# Patient Record
Sex: Male | Born: 1940
Health system: Southern US, Community
[De-identification: ages and names within clinical notes are randomized; demographics above are authoritative.]

## PROBLEM LIST (undated history)

## (undated) DIAGNOSIS — C61 Malignant neoplasm of prostate: Secondary | ICD-10-CM

## (undated) DIAGNOSIS — I10 Essential (primary) hypertension: Secondary | ICD-10-CM

## (undated) DIAGNOSIS — M722 Plantar fascial fibromatosis: Secondary | ICD-10-CM

## (undated) DIAGNOSIS — E785 Hyperlipidemia, unspecified: Secondary | ICD-10-CM

## (undated) DIAGNOSIS — I2510 Atherosclerotic heart disease of native coronary artery without angina pectoris: Secondary | ICD-10-CM

## (undated) DIAGNOSIS — N189 Chronic kidney disease, unspecified: Secondary | ICD-10-CM

## (undated) HISTORY — DX: Hyperlipidemia, unspecified: E78.5

## (undated) HISTORY — DX: Plantar fascial fibromatosis: M72.2

## (undated) HISTORY — DX: Atherosclerotic heart disease of native coronary artery without angina pectoris: I25.10

## (undated) HISTORY — DX: Malignant neoplasm of prostate: C61

## (undated) HISTORY — DX: Essential (primary) hypertension: I10

---

## 2002-08-12 ENCOUNTER — Emergency Department (HOSPITAL_COMMUNITY): Admission: EM | Admit: 2002-08-12 | Discharge: 2002-08-12 | Payer: Self-pay | Admitting: Emergency Medicine

## 2002-08-12 ENCOUNTER — Encounter: Payer: Self-pay | Admitting: Emergency Medicine

## 2003-08-15 ENCOUNTER — Inpatient Hospital Stay (HOSPITAL_COMMUNITY): Admission: EM | Admit: 2003-08-15 | Discharge: 2003-08-17 | Payer: Self-pay | Admitting: Emergency Medicine

## 2003-08-20 ENCOUNTER — Encounter: Admission: RE | Admit: 2003-08-20 | Discharge: 2003-08-20 | Payer: Self-pay | Admitting: Family Medicine

## 2003-09-20 ENCOUNTER — Inpatient Hospital Stay (HOSPITAL_COMMUNITY): Admission: RE | Admit: 2003-09-20 | Discharge: 2003-09-22 | Payer: Self-pay | Admitting: Urology

## 2003-09-20 ENCOUNTER — Encounter (INDEPENDENT_AMBULATORY_CARE_PROVIDER_SITE_OTHER): Payer: Self-pay | Admitting: Specialist

## 2004-09-19 ENCOUNTER — Ambulatory Visit: Admission: RE | Admit: 2004-09-19 | Discharge: 2004-09-19 | Payer: Self-pay | Admitting: Cardiology

## 2004-09-19 ENCOUNTER — Ambulatory Visit: Payer: Self-pay | Admitting: Cardiology

## 2004-09-19 ENCOUNTER — Inpatient Hospital Stay (HOSPITAL_COMMUNITY): Admission: EM | Admit: 2004-09-19 | Discharge: 2004-09-23 | Payer: Self-pay | Admitting: Emergency Medicine

## 2004-09-23 IMAGING — CR DG CHEST 2V
2 series · 2 of 2 positions shown · non-contrast
Comparison: none

CLINICAL DATA: Chest pain.
 CHEST X-RAY:
 Two views of the chest are compared to a chest x-ray [DATE].  The lungs are clear.  The heart is within normal limits in size.  No bony abnormality is seen.

[w chest pa]
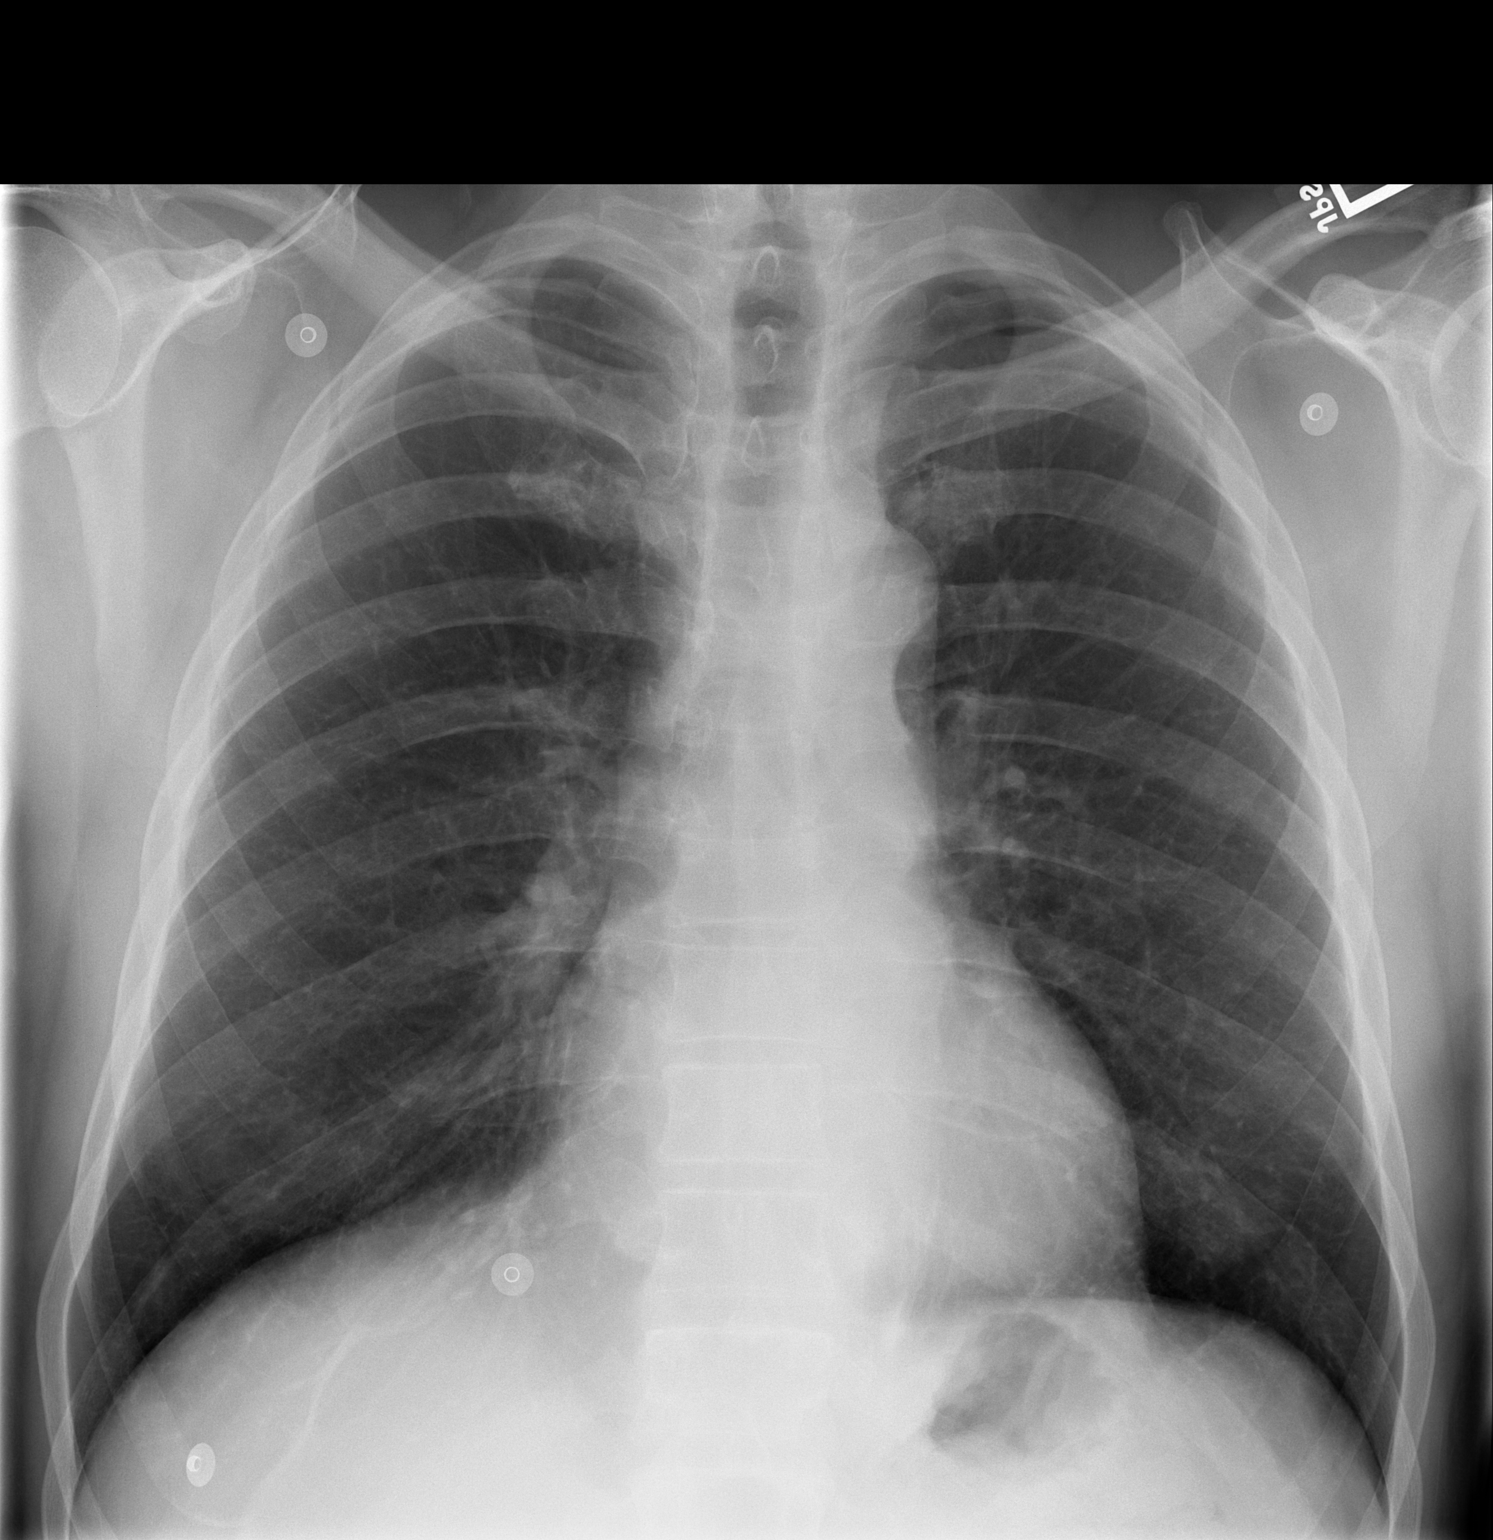

[w chest lat]
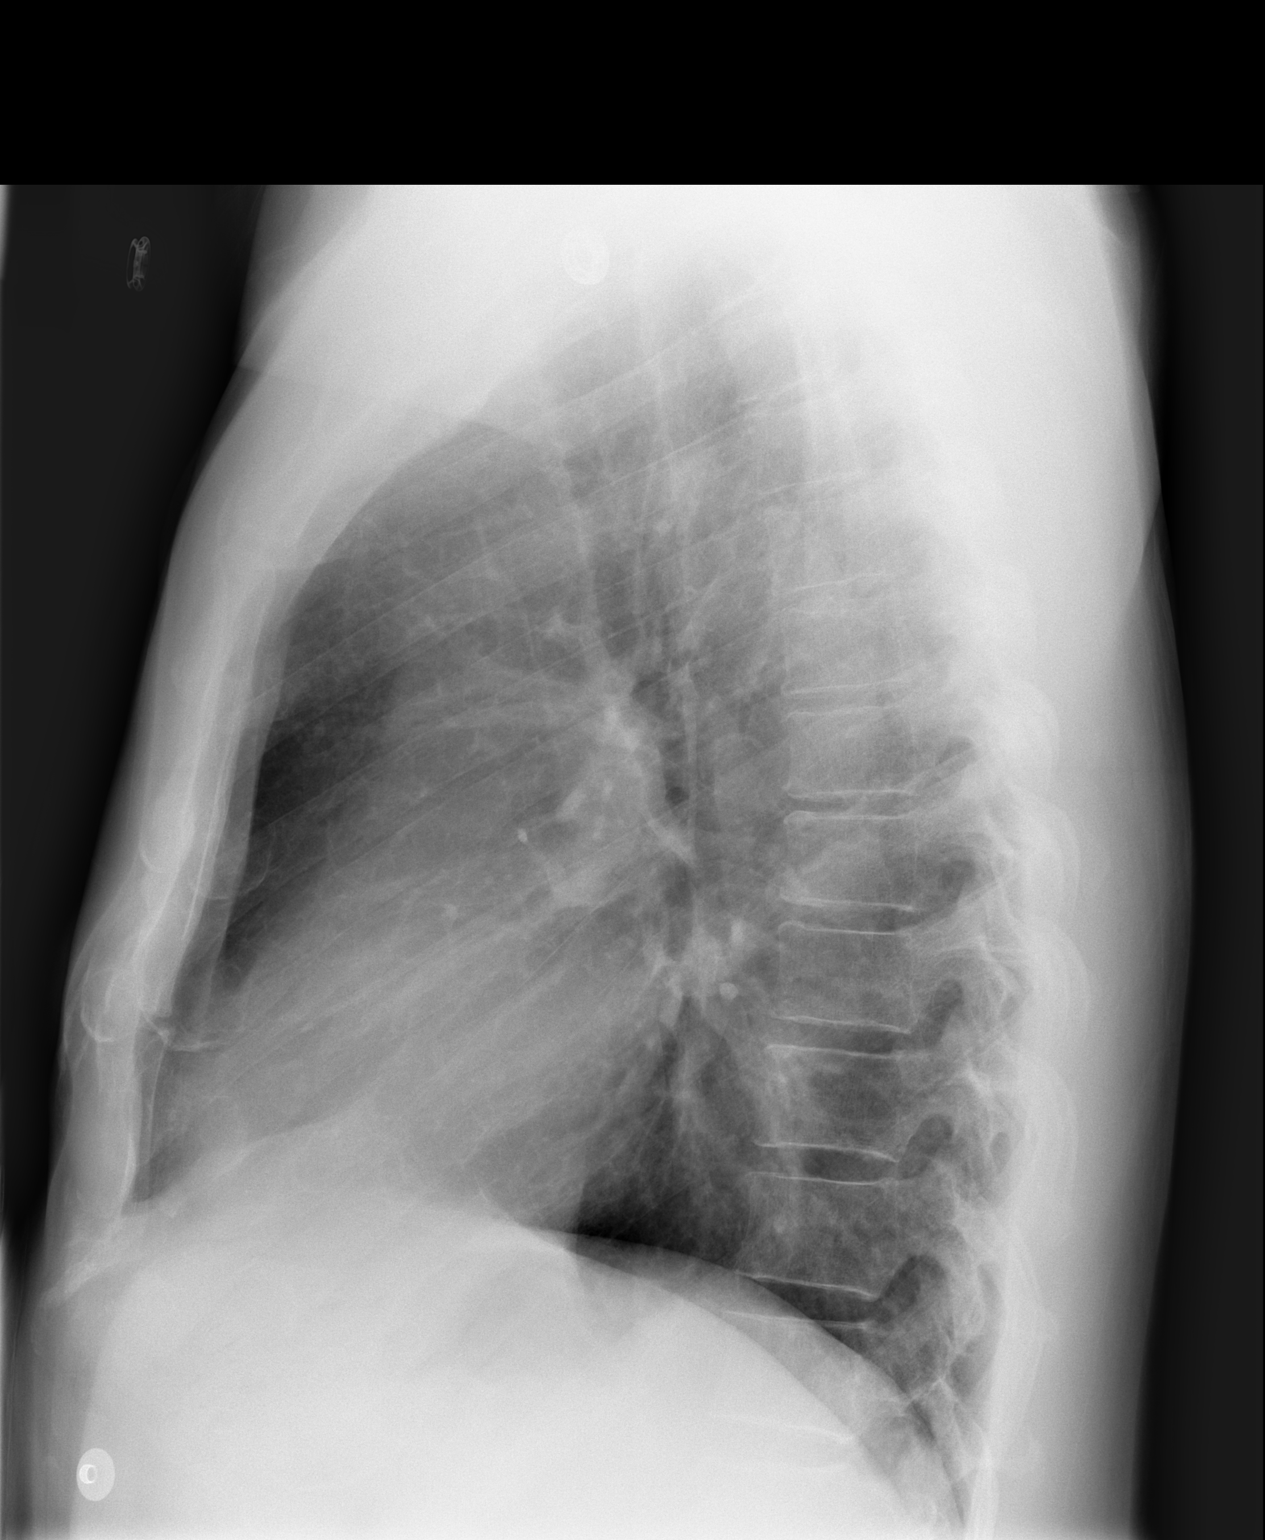

[2 of 2 positions shown; findings below may reference images not displayed]

IMPRESSION: No active lung disease.

## 2004-10-02 ENCOUNTER — Encounter (HOSPITAL_COMMUNITY): Admission: RE | Admit: 2004-10-02 | Discharge: 2004-11-01 | Payer: Self-pay | Admitting: Cardiology

## 2004-10-09 ENCOUNTER — Ambulatory Visit: Payer: Self-pay | Admitting: Cardiology

## 2004-10-17 ENCOUNTER — Ambulatory Visit: Payer: Self-pay | Admitting: Cardiology

## 2004-10-29 ENCOUNTER — Ambulatory Visit: Payer: Self-pay | Admitting: *Deleted

## 2004-10-29 ENCOUNTER — Ambulatory Visit: Payer: Self-pay

## 2004-11-03 ENCOUNTER — Encounter (HOSPITAL_COMMUNITY): Admission: RE | Admit: 2004-11-03 | Discharge: 2004-12-03 | Payer: Self-pay | Admitting: Cardiology

## 2004-12-05 ENCOUNTER — Encounter (HOSPITAL_COMMUNITY): Admission: RE | Admit: 2004-12-05 | Discharge: 2005-01-04 | Payer: Self-pay | Admitting: Cardiology

## 2004-12-18 ENCOUNTER — Ambulatory Visit: Payer: Self-pay | Admitting: Cardiology

## 2005-01-05 ENCOUNTER — Encounter (HOSPITAL_COMMUNITY): Admission: RE | Admit: 2005-01-05 | Discharge: 2005-02-04 | Payer: Self-pay | Admitting: Cardiology

## 2005-06-08 HISTORY — PX: PROSTATECTOMY: SHX69

## 2005-10-02 ENCOUNTER — Ambulatory Visit: Payer: Self-pay | Admitting: Cardiology

## 2005-10-06 ENCOUNTER — Ambulatory Visit (HOSPITAL_BASED_OUTPATIENT_CLINIC_OR_DEPARTMENT_OTHER): Admission: RE | Admit: 2005-10-06 | Discharge: 2005-10-06 | Payer: Self-pay | Admitting: Urology

## 2005-10-31 ENCOUNTER — Inpatient Hospital Stay (HOSPITAL_COMMUNITY): Admission: EM | Admit: 2005-10-31 | Discharge: 2005-11-02 | Payer: Self-pay | Admitting: Emergency Medicine

## 2005-10-31 ENCOUNTER — Ambulatory Visit: Payer: Self-pay | Admitting: Internal Medicine

## 2005-11-23 ENCOUNTER — Ambulatory Visit: Payer: Self-pay | Admitting: Cardiology

## 2006-01-27 ENCOUNTER — Ambulatory Visit: Payer: Self-pay | Admitting: Cardiology

## 2007-02-23 ENCOUNTER — Ambulatory Visit: Payer: Self-pay | Admitting: Cardiology

## 2007-09-27 ENCOUNTER — Ambulatory Visit: Payer: Self-pay | Admitting: Cardiovascular Disease

## 2007-09-27 ENCOUNTER — Inpatient Hospital Stay (HOSPITAL_COMMUNITY): Admission: EM | Admit: 2007-09-27 | Discharge: 2007-10-04 | Payer: Self-pay | Admitting: Internal Medicine

## 2007-09-28 ENCOUNTER — Encounter: Payer: Self-pay | Admitting: Cardiothoracic Surgery

## 2007-09-29 ENCOUNTER — Encounter: Payer: Self-pay | Admitting: Internal Medicine

## 2007-09-29 ENCOUNTER — Ambulatory Visit: Payer: Self-pay | Admitting: Cardiothoracic Surgery

## 2007-10-02 ENCOUNTER — Encounter: Payer: Self-pay | Admitting: Cardiology

## 2007-10-21 ENCOUNTER — Ambulatory Visit: Payer: Self-pay | Admitting: Cardiology

## 2008-03-08 ENCOUNTER — Ambulatory Visit: Payer: Self-pay | Admitting: Cardiology

## 2009-01-23 ENCOUNTER — Encounter (INDEPENDENT_AMBULATORY_CARE_PROVIDER_SITE_OTHER): Payer: Self-pay | Admitting: *Deleted

## 2009-03-15 DIAGNOSIS — I2510 Atherosclerotic heart disease of native coronary artery without angina pectoris: Secondary | ICD-10-CM | POA: Insufficient documentation

## 2009-03-15 DIAGNOSIS — E785 Hyperlipidemia, unspecified: Secondary | ICD-10-CM

## 2009-03-15 DIAGNOSIS — I10 Essential (primary) hypertension: Secondary | ICD-10-CM | POA: Insufficient documentation

## 2009-03-20 ENCOUNTER — Ambulatory Visit: Payer: Self-pay | Admitting: Cardiology

## 2009-04-01 ENCOUNTER — Encounter: Payer: Self-pay | Admitting: Cardiology

## 2009-04-02 ENCOUNTER — Telehealth: Payer: Self-pay | Admitting: Cardiology

## 2009-06-18 ENCOUNTER — Encounter (INDEPENDENT_AMBULATORY_CARE_PROVIDER_SITE_OTHER): Payer: Self-pay | Admitting: *Deleted

## 2009-10-31 ENCOUNTER — Emergency Department (HOSPITAL_COMMUNITY): Admission: EM | Admit: 2009-10-31 | Discharge: 2009-10-31 | Payer: Self-pay | Admitting: Emergency Medicine

## 2010-03-26 ENCOUNTER — Ambulatory Visit: Payer: Self-pay | Admitting: Cardiology

## 2010-03-28 ENCOUNTER — Encounter: Payer: Self-pay | Admitting: Cardiology

## 2010-07-08 NOTE — Miscellaneous (Signed)
Summary: update med  Clinical Lists Changes  Medications: Added new medication of PLAVIX 75 MG TABS (CLOPIDOGREL BISULFATE) Take one tablet by mouth daily 

## 2010-07-08 NOTE — Assessment & Plan Note (Signed)
Summary: [facility]  Medications Added ASPIRIN 81 MG  TABS (ASPIRIN) 1 by mouth daily FISH OIL   OIL (FISH OIL) 1 by mouth daily VITAMIN D3 1000 UNIT CAPS (CHOLECALCIFEROL) 1 by mouth daily      Allergies Added: NKDA  Visit Type:  Follow-up Primary Provider:  [facility]  CC:  CAD.  History of Present Illness: The patient presents for one year followup. Since I last saw him he has done very though retired he is very active. This he denies any chest discomfort, neck or arm discomfort. He never had palpitations, presyncope or syncope. He has no PND or orthopnea. He has had no weight gain or edema.  Current Medications (verified): 1)  Metoprolol Tartrate 50 Mg Tabs (Metoprolol Tartrate) .... 1/2 Two Times A Day 2)  Crestor 20 Mg Tabs (Rosuvastatin Calcium) .Marland Kitchen.. 1 By Mouth Daily 3)  Plavix 75 Mg Tabs (Clopidogrel Bisulfate) .... Take One Tablet By Mouth Daily 4)  Aspirin 81 Mg  Tabs (Aspirin) .Marland Kitchen.. 1 By Mouth Daily 5)  Fish Oil   Oil (Fish Oil) .Marland Kitchen.. 1 By Mouth Daily 6)  Vitamin D3 1000 Unit Caps (Cholecalciferol) .Marland Kitchen.. 1 By Mouth Daily  Allergies (verified): No Known Drug Allergies  Past History:  Past Medical History: Last updated: 03/15/2009   Coronary artery disease (recent inferior   myocardial infarction).  The patient had previous stenting of his right   coronary artery.  Subsequently, he had stent thrombosis in the right   coronary stent and failed to keep that vessel loop even after   restenting.  He also had LAD 50-60% stenosis, mid to proximal, and OM1   40% stenosis), well-preserved ejection fraction, hypertension,   hyperlipidemia, prostate cancer status post resection in April 2005,   constipation, hemorrhoids.   Past Surgical History: Prostatectomy in 2007   Review of Systems       As stated in the HPI and negative for all other systems.   Vital Signs:  Patient profile:   69 year old male Height:      192 inches Weight:      69 pounds BMI:      1.32 Pulse rate:   57 / minute Resp:     18 per minute BP sitting:   132 / 82  (right arm)  Vitals Entered By: Marrion Coy, CNA (March 26, 2010 1:18 PM)  Physical Exam  General:  Well developed, well nourished, in no acute distress. Head:  normocephalic and atraumatic Eyes:  PERRLA/EOM intact; conjunctiva and lids normal. Mouth:  Teeth, gums and palate normal. Oral mucosa normal. Neck:  Neck supple, no JVD. No masses, thyromegaly or abnormal cervical nodes. Chest Wall:  no deformities or breast masses noted Lungs:  Clear bilaterally to auscultation and percussion. Heart:  Non-displaced PMI, chest non-tender; regular rate and rhythm, S1, S2 without murmurs, rubs or gallops. Carotid upstroke normal, no bruit. Normal abdominal aortic size, no bruits. Femorals normal pulses, no bruits. Pedals normal pulses. No edema, no varicosities. Abdomen:  Bowel sounds positive; abdomen soft and non-tender without masses, organomegaly, or hernias noted. No hepatosplenomegaly. Msk:  Back normal, normal gait. Muscle strength and tone normal. Extremities:  No clubbing or cyanosis. Neurologic:  Alert and oriented x 3. Skin:  Intact without lesions or rashes. Cervical Nodes:  no significant adenopathy Inguinal Nodes:  no significant adenopathy Psych:  Normal affect.   EKG  Procedure date:  03/26/2010  Findings:      Sinus rhythm, rate 56, axis  within normal limits, old inferior infarct with chronic inferior T-wave inversions, early transition lead 2, no acute ST-T wave changes  Impression & Recommendations:  Problem # 1:  CAD (ICD-414.00) He has no new symptoms. He will continue with risk reduction in meds as listed.  Problem # 2:  HYPERTENSION (ICD-401.9) His blood pressure is well controlled. We will continue meds as listed.  Problem # 3:  HYPERLIPIDEMIA (ICD-272.4) I will repeat a lipid profile. His ratio in October of last year was excellent. He will have liver enzymes as well.

## 2010-10-21 NOTE — Assessment & Plan Note (Signed)
[facility]                            CARDIOLOGY OFFICE NOTE   NAME:Nicholas [name], Nicholas [name]                      MRN:          [id number]  DATE:03/20/2009                            DOB:          07/05/1940    PRIMARY CARE PHYSICIAN:  [name], MD   REASON FOR PRESENTATION:  Coronary artery disease.   HISTORY OF PRESENT ILLNESS:  The patient presents for followup of the  above.  Since I last saw him, he has continued to do quite well.  He is  active.  He works vigorous job.  With this, he denies any chest  discomfort, neck or arm discomfort.  He has no palpitations, presyncope,  or syncope.  He has had no PND or orthopnea.  He takes medications  routinely.  He has not had his lipids checked in quite a while.   PAST MEDICAL HISTORY:  Coronary artery disease (recent inferior  myocardial infarction status post stenting of the right coronary artery.  He subsequently had stent thrombosis in the right stent and failed to  keep that vessel open even after restenting.  He has LAD at 50-60%  stenosis mid to proximal, and OM1 had 40% stenosis), well-preserved  ejection fraction, hypertension, hyperlipidemia, prostate cancer status  post resection in 2005, constipation, hemorrhoids.   ALLERGIES:  None.   MEDICATIONS:  1. Plavix 75 mg daily.  2. Fish oil.  3. Crestor 20 mg daily.  4. Toprol 25 mg b.i.d.  5. Aspirin 81 mg daily.  6. Multivitamin.   REVIEW OF SYSTEMS:  As stated in the HPI, and otherwise negative for all  other systems.   PHYSICAL EXAMINATION:  GENERAL:  The patient is in no distress.  VITAL SIGNS:  Blood pressure 124/84, heart rate 65 and regular, weight  192 pounds, body mass index 29.  HEENT:  Eyelids are unremarkable.  Pupils equal, round, and reactive to  light.  Fundi not visualized.  Oral mucosa normal.  NECK:  No jugular venous distention at 45 degrees.  Carotid upstroke  brisk and symmetric.  No bruits.  No thyromegaly.  LYMPHATICS:  No cervical, axillary, or inguinal adenopathy.  LUNGS:  Clear to auscultation bilaterally.  BACK:  No costovertebral angle tenderness.  CHEST:  Unremarkable.  HEART:  PMI not displaced or sustained, S1 and S2 within normal.  No S3.  No S4.  No clicks.  No rubs.  No murmurs.  ABDOMEN:  Flat, positive bowel sounds, normal in frequency and pitch, no  bruits, rebound, guarding, or midline pulsatile mass.  No hepatomegaly.  No splenomegaly.  SKIN:  No rashes.  No nodules.  EXTREMITIES:  Pulses 2+ throughout.  No edema, cyanosis, or clubbing.  NEURO:  Oriented to person, place, and time.  Cranial nerves II through  XII grossly intact.  Motor grossly intact.   EKG; sinus rhythm, rate 65, axis within normal limits, intervals within  normal limits, old inferior infarct, no acute ST-T wave change.   ASSESSMENT AND PLAN:  1. Coronary artery disease.  The patient is having no new symptoms.      No  further cardiovascular testing is suggested.  He will continue      with risk reduction.  2. Dyslipidemia.  I would check a lipid profile and liver enzymes when      he comes back and he has been given instructions to come back      fasting.  3. Hypertension.  Blood pressure is controlled and he will continue      the meds as listed.  4. Followup.  We will see him back in 1 year or sooner if needed.     Rollene Rotunda, MD, [facility]  Electronically Signed    JH/MedQ  DD: 03/20/2009  DT: 03/21/2009  Job #: [id number]   cc:   [name], M.D.

## 2010-10-21 NOTE — Cardiovascular Report (Signed)
NAME:  Nicholas [name], Nicholas [name] NO.:  [id number]   MEDICAL RECORD NO.:  [id number]          PATIENT TYPE:  OIB   LOCATION:  2903                         FACILITY:  [facility]   PHYSICIAN:  Veverly Fells. Excell Seltzer, MD  DATE OF BIRTH:  March 27, 1941   DATE OF PROCEDURE:  09/28/2007  DATE OF DISCHARGE:                            CARDIAC CATHETERIZATION   PROCEDURES:  1. Selective coronary angiography.  2. Aspiration thrombectomy.  3. Percutaneous transluminal coronary angioplasty and stenting of the      right coronary artery.   INDICATIONS:  Nicholas [name] is a very nice 69 year old gentleman who  presented yesterday with an acute myocardial infarction.  This was  secondary to stent thrombosis in the distal right coronary artery.  He  has a Horizon stent that was originally placed.  He did well with his  intervention.  A 32-mm Liberte bare metal stent was placed in the vessel  inside of the previous stent yesterday.  He had been continued on  Integrilin until this morning, which took him out to 18 hours.  He was  loaded with 600 mg of Plavix during the procedure.  Earlier this morning  after his Integrilin had been discontinued, he developed recurrent  severe substernal chest pain.  His EKG was essentially unchanged but his  symptoms were identical to yesterday.  He was brought back emergently  for re-look catheterization with a high index of suspicion of  reocclusion.   Risks and indications of the procedure were reviewed with the patient.  Emergency consent was obtained.  The left groin was prepped, draped,  anesthetized with 1% lidocaine.  Using modified Seldinger technique, a 6-  French sheath was placed in the left femoral artery.  A 6-French JL-4  catheter was inserted and right coronary angiography was performed.  This demonstrated a totally-occluded vessel proximal to the stent.  Fifth units per kg of heparin was given, Integrilin double bolus and  drip was given.  A 6-French  guide catheter was inserted and once a  therapeutic ACT was achieved, a Cougar guidewire was passed without  difficulty beyond the occlusion and into the stented segment within the  distal vessel.  There was a moderate of difficulty getting through the  stent.  I passed an aspiration catheter down and was able to aspirate  through the proximal and midportion of the vessel but was not able to  get into the stent itself.  Following aspiration, an angiography  demonstrated the appearance of spiral dissection in the midportion of  the vessel.  A 2.0 x 20-mm balloon was taken down into the stent and  multiple inflations were done at up to 14 atmospheres.  Balloon  inflations were performed all the way through the stented segment and  even proximal to the stent.  The balloon was then passed distally and  was inflated into the posterior AV segment since there was no flow into  that vessel.  The wire was then redirected across an occluded PDA branch  and the balloon was inflated into the PDA as well.  At that point I  upsized the  balloon to a 3.0 x 20 and did multiple inflations in the  stent.  Following that there was flow restored into the posterior AV  segment and posterolateral branches.  There was significant residual  thrombus in the distal stent, and the PDA remained occluded.  There was  a large area of thrombus and probable dissection throughout the  midportion of the right coronary artery as well.  The same 2.0 x 20-mm  balloon was once again advanced down into the posterolateral branch and  repeat balloon inflations were done up to 10 atmospheres to try to  improve flow and residual thrombus in that area.  At that point I  elected to treat the midportion of the right coronary artery, which  appeared to have a large spiral dissection.  In an overlapping fashion  from the previously-placed distal stent, a 3.5 x 32-mm Liberte stent was  placed and deployed at 16 atmospheres.  A second  stent was necessary to  cover the proximal dissection and a 4.0 x 32-mm Liberte was chosen.  This was deployed at 16 atmospheres as well in overlapping fashion with  the mid stent.  Angiography following stenting showed improvement in the  area of dissection, but there was sluggish flow in the entire vessel  with TIMI-2 flow.  There was residual thrombus just at the bifurcation  of the distal right coronary artery into the PDA and posterolateral  branch.  A final 2.0 x 15-mm balloon was passed down into the  posterolateral branch and prolonged inflations of 10 atmospheres were  done.  The long stented segment was postdilated with a 4.0 x 20-mm  Quantum Maverick, which was taken to 18 atmospheres on multiple  inflations.  At the completion of the procedure the patient was chest  pain-free.  There was residual laminated thrombus at the bifurcation of  the distal right coronary artery into the posterolateral branch.  The  PDA remained occluded.  The proximal vessel and entire stented segment  appeared well intact.  The distal stent that was placed yesterday had  laminated thrombus as well.  The flow was TIMI-2.   The guide catheter and wire were removed and an Angio-Seal device was  used to close the femoral arteriotomy.   FINDINGS:  The native right coronary artery is occluded in the mid to  distal portion with heavy thrombus and possible dissection in the  midportion of the vessel.  The occlusion is proximal to the stent in the  distal RCA that was placed yesterday.  Initial flow was TIMI-0.  Flow at  the completion of the procedure was TIMI-2.   ASSESSMENT:  Acute reocclusion of the right coronary artery 24 hours  post percutaneous coronary intervention for an acute myocardial  infarction.  The event yesterday was secondary to stent thrombosis, and  today's event was either secondary to recurrent stent thrombosis versus  coronary dissection.   PLAN:  I am going to continue Nicholas [name]  on Integrilin.  His distal  vessel is relatively small and may not be suitable for bypass, but I am  going to consult CVTS for consideration of coronary bypass.  He has  residual moderate stenosis in the LAD.  While the inferior wall  territory does not appear large at this point, he becomes highly  symptomatic with reocclusion.  I will hold his Plavix for the time being  and continue him on Integrilin.      Veverly Fells. Excell Seltzer, MD  Electronically Signed  MDC/MEDQ  D:  09/28/2007  T:  09/28/2007  Job:  [id number]

## 2010-10-21 NOTE — H&P (Signed)
NAME:  Nicholas [name], Nicholas [name] NO.:  [id number]   MEDICAL RECORD NO.:  [id number]          PATIENT TYPE:  OIB   LOCATION:  2903                         FACILITY:  [facility]   PHYSICIAN:  Nicholas Fells. Excell Seltzer, MD  DATE OF BIRTH:  08-06-1940   DATE OF ADMISSION:  09/27/2007  DATE OF DISCHARGE:                              HISTORY & PHYSICAL   PRIMARY CARDIOLOGIST:  Nicholas Rotunda, MD, [facility].   PRIMARY CARE Nicholas [name]:  Nicholas [name], M.D.   PATIENT PROFILE:  The patient is a 70 year old married Caucasian male  with a prior history of inferior ST elevation MI treated with Horizon  study stent with recurrent MI secondary to stent thrombosis who presents  with recurrent inferior ST segment elevation MI.   PRIOR PROBLEMS:  1. Inferior ST segment elevation myocardial infarction/CAD.      a.     Status post inferior STEMI in April 2006, with occluded RCA       and placement of 3.5 x 32-mm Horizon study stent.      b.     Inferior STEMI on May 2007, status post cutting balloon       angioplasty for stent thrombosis and RCA.  2. Hypertension.  3. Hyperlipidemia.  4. Remote tobacco abuse.  5. History of prostate carcinoma.   HISTORY OF PRESENT ILLNESS:  This is a 70 year old married Caucasian  male with a history of CAD, status post inferior ST segment elevation MI  in April 2006, which was successfully treated with Horizon study stent.  He came off Plavix 1 year following that procedure and approximately 1  month later in May 2007, he had recurrent chest pain and inferior ST  segment elevation MI secondary to stent thrombosis.  This was  successfully treated with cutting balloon angioplasty.  He was in his  usual state of health until approximately 8:30 a.m. when he was outside  working around the house, developed sudden onset of 10/10 substernal  chest pressure with shortness of breath and diaphoresis.  He called the  EMS, they arrived at approximately 8:42 a.m.  EMS provided him  with 4  baby aspirin, 2  nitroglycerin, and 2 mg of Morphine.  ECG showed  inferior ST segment elevation and code STEMI was activated.  The patient  was taken to the [facility] cath lab and then arrived here approximately  9:15 a.m.  He still complaints of 5/10 chest pain, awaiting emergent  catheterization.   ALLERGIES:  No known drug allergies.   HOME MEDICATIONS:  1. Toprol-XL 25 mg daily.  2. Plavix 75 mg daily.  3. Pravachol 40 mg nightly.  4. Aspirin 81 mg daily.  5. Multivitamin daily.  6. Omega-3 fatty acids daily.   FAMILY HISTORY:  The mother died of cancer at 56.  The father died of an  MI at 33.   SOCIAL HISTORY:  He lives in [place] with his wife.  He has a 40-pack-  year history of tobacco abuse, quitting approximately 15 years ago.  Denies any alcohol or drugs.  Routinely exercises, but he is active at  work.  REVIEW OF SYSTEMS:  Positive for chest pain, shortness of breath,  dyspnea on exertion, diaphoresis.  He was incontinent of stool and  transit.  Otherwise, all system review negative.   PHYSICAL EXAMINATION:  VITAL SIGNS:  Afebrile, heart rate 55,  respirations 20, blood pressure 129/75, and pulse ox saturation 98% on  nonrebreather.  GENERAL:  Pleasant white male complaining of chest discomfort.  HEENT:  Normal.  SKIN:  Warm and dry without lesions or masses.  NECK:  No bruit or JVD.  LUNGS:  Respirations are unlabored.  Clear to auscultation.  CARDIAC:  Regular S1 and S2.  Positive S4.  No murmurs.  ABDOMEN:  Round, soft, nontender, nondistended, and bowel sounds  present.  EXTREMITIES:  Lower extremities warm, dry, pink.  No clubbing, cyanosis,  or edema.  Dorsalis pedis, posterior tibial pulses 2+ and equal  bilaterally.  NEURO:  Grossly intact.  Nonfocal.   LABORATORY DATA:  Chest x-ray is pending.  EKG shows sinus rhythm with 2-  to 3-mm inferior ST segment elevation.  Lab work is pending.   ASSESSMENT AND PLAN:  1. Acute inferior ST  segment elevation MI, plan for emergent cath.      The patient has been reloaded with 300 mg oral Plavix.  We will      continue his aspirin but increase dose of 325 daily.  Continue      statin and beta-blocker (unless he develops bradycardia and      hypertension with reperfusion).  Consider ACE inhibitor with      history of wall motion abnormality.  2. Hypertension, stable.  3. Hyperlipidemia.  Check lipids and LFTs.  Continue statin.      Nicholas [name], ANP      Nicholas Fells. Excell Seltzer, MD  Electronically Signed    CB/MEDQ  D:  09/27/2007  T:  09/28/2007  Job:  [id number]

## 2010-10-21 NOTE — Cardiovascular Report (Signed)
NAME:  Nicholas [name], LANGHAM NO.:  [id number]   MEDICAL RECORD NO.:  [id number]          PATIENT TYPE:  INP   LOCATION:  2009                         FACILITY:  [facility]   PHYSICIAN:  Corky Crafts, MDDATE OF BIRTH:  05-26-1941   DATE OF PROCEDURE:  09/29/2007  DATE OF DISCHARGE:  10/04/2007                            CARDIAC CATHETERIZATION   PROCEDURES PERFORMED:  Coronary angiogram, PCI of the right coronary  artery, aspiration thrombectomy.   INDICATIONS:  Recurrent ST-elevation MI and inferior stent thrombosis.   PROCEDURE AND NARRATIVE:  The risks and benefits of cardiac cath were  briefly explained to the patient.  He had undergone 2 catheterizations  in the past couple of days because of stent thrombosis.  He had  recurrent pain.  His right groin was infiltrated with 1% lidocaine.  A 6-  [place] arterial sheath was placed into the right femoral artery using  modified Seldinger technique.  Left coronary artery angiography was  performed using a JL-4 pigtail catheter.  The catheter was advanced to  the ostium under fluoroscopic guidance.  Digital angiography was  performed in multiple projections using hand injection contrast.  Right  coronary artery angiography was performed using a JR-4 guiding catheter  and PCI was then performed.  The sheath was removed using manual  compression.  Heparin and Integrilin were used for anticoagulation.   FINDINGS:  Left main is widely patent.  The left circumflex was widely  patent with mild irregularities.  The OM1 was widely patent also with  mild irregularities.  The left anterior descending had moderate  atherosclerosis in the proximal to mid section with normal flow.  The  first, second, and third diagonals were all small and widely patent.  The right coronary artery was occluded in the midportion at the most  recent stent.  There was TIMI 0 flow.  A JR-4 guiding catheter was used.  Prowater wire would not cross the  occluded area.  A whisper wire was  then used.  This did successfully crossed.  The [place] catheter was  advanced to the lesion, but it did not get all the way there.  Thrombectomy was performed without any successful aspiration of clot.  The guide was changed out to an AR-2.  The whisper wire crossed.  A 3.0  x 15 and 3.0 x 8 Voyager were advanced to the lesion without any  success.  The 3.0 x 8 was inflated 2 atmospheres for 20 seconds just to  try and change the geometry of the vessel to help advance the balloon,  but this still did not help to advance.  Hockey-stick guide was then  used and we could not get a Luge wire to cross.  The procedure was  aborted at that point.   IMPRESSION:  Recurrent inferior ST-elevation myocardial infarction,  unable to revascularize the right coronary artery.  There was  significant distal vessel disease with poor runoff based on the prior  cath and a very long stented area which likely contributed to the stent  thrombosis.   RECOMMENDATIONS:  Continue beta-blocker and pain management for the  MI.  He will likely complete his inferior MI soon given that he has  thrombosed stents twice within the last 36 hours.  Continue Integrilin  for now.  Dr. Excell Seltzer and Dr. Tyrone Sage to determine further plan.  I did  explain these results to the patient.     Corky Crafts, MD  Electronically Signed    JSV/MEDQ  D:  11/25/2007  T:  11/26/2007  Job:  [id number]

## 2010-10-21 NOTE — Assessment & Plan Note (Signed)
[facility] HEALTHCARE                            CARDIOLOGY OFFICE NOTE   NAME:Nicholas [name], Nicholas [name]                      MRN:          [id number]  DATE:03/08/2008                            DOB:          [birth year]    PRIMARY:  [name], MD   REASON FOR PRESENTATION:  Evaluate the patient with coronary artery  disease.   HISTORY OF PRESENT ILLNESS:  The patient presents for followup of the  above.  Since I last saw him, he has done quite well.  He has been very  active doing a lot of work in his yard and cutting down trees, building  a roof.  With this, he has had no chest pressure, neck or arm  discomfort.  He has had no palpitation, presyncope, or syncope.  He says  he feels better than he has been in 20 years.  He has some easy bruising  on his arms.   PAST MEDICAL HISTORY:  Coronary artery disease (recent inferior  myocardial infarction).  The patient had previous stenting of his right  coronary artery.  Subsequently, he had stent thrombosis in the right  coronary stent and failed to keep that vessel loop even after  restenting.  He also had LAD 50-60% stenosis, mid to proximal, and OM1  40% stenosis), well-preserved ejection fraction, hypertension,  hyperlipidemia, prostate cancer status post resection in April 2005,  constipation, hemorrhoids.   ALLERGIES:  None.   MEDICATIONS:  Plavix 75 mg daily, fish oil, Crestor 20 mg daily, Toprol  25 mg b.i.d., aspirin 325 mg daily.   REVIEW OF SYSTEMS:  As stated in the HPI and otherwise negative for all  other systems.   PHYSICAL EXAMINATION:  GENERAL:  The patient is in no distress.  Blood  pressure 124/90, heart rate 60, and regular.  HEENT:  Eyes unremarkable, pupils equal, round, and reactive to light,  fundi not visualized, oral mucosa remarkable.  NECK:  No jugular venous distention at 45 degrees, carotid upstroke  brisk and symmetric, no bruits, no thyromegaly.  LYMPHATICS:  No cervical,  axillary, inguinal adenopathy.  LUNGS:  Clear to auscultation bilaterally.  BACK:  No costovertebral angle tenderness.  CHEST:  Unremarkable.  HEART:  PMI not displaced or sustained, S1 and S2 within normal limits,  no S3, no S4, no clicks, rubs, no murmurs.  ABDOMEN:  Flat, positive bowel sounds, normal in frequency and pitch, no  bruits, rebound, guarding, or midline pulsatile mass, organomegaly.  SKIN:  No rashes, nodules.  EXTREMITIES:  2+ pulse, no edema.   EKG, sinus rhythm, rate 57, old inferior infarct, no acute ST-wave  changes.   ASSESSMENT AND PLAN:  1. Coronary artery disease.  The patient has coronary disease as      described.  He is doing very well now and is totally asymptomatic.      We will continue with aggressive secondary risk reduction.  I would      have him continue his Plavix given his problems and his      nonobstructive disease elsewhere.  I told him to go down  to 81 mg      of aspirin since he is having a fair amount of bleeding and      bruising when he nicks and bumps himself with his work.  2. Dyslipidemia.  He is having this followed closely by Dr. Christell Constant.  He      had a very good HDL of 51 at the last check, though his LDL was      slightly elevated to 103.  He will continue the medications as      listed.  3. Followup.  I will see him back in 1 year or sooner if he has any      problems.     Rollene Rotunda, MD, [facility]  Electronically Signed    JH/MedQ  DD: 03/08/2008  DT: 03/09/2008  Job #: [id number]   cc:   [name], M.D.

## 2010-10-21 NOTE — Discharge Summary (Signed)
NAME:  Nicholas [name], Nicholas [name] NO.:  [id number]   MEDICAL RECORD NO.:  [id number]          PATIENT TYPE:  INP   LOCATION:  2009                         FACILITY:  [facility]   PHYSICIAN:  Nicholas Rotunda, MD, FACCDATE OF BIRTH:  April 26, 1941   DATE OF ADMISSION:  09/27/2007  DATE OF DISCHARGE:  10/04/2007                               DISCHARGE SUMMARY   PRIMARY CARDIOLOGIST:  Nicholas Rotunda, MD, [facility].   PRIMARY CARE Nicholas [name]:  Nicholas Penna, MD   DISCHARGE DIAGNOSIS:  Acute inferior ST-segment elevation myocardial  infarction.   SECONDARY DIAGNOSES:  1. Coronary artery disease.  2. Hypertension.  3. Hyperlipidemia.  4. Remote tobacco abuse.  5. History of prostate cancer.  6. Paroxysmal atrial fibrillation.   ALLERGIES:  ANTIHISTAMINES.   PROCEDURES:  Left heart cardiac catheterization with PCI and stenting of  the distal right coronary artery with 3.0 x 32-mm Liberte bare-metal  stent.  Relook catheterization on September 28, 2007, with bare-metal  stenting of the RCA.  Relook catheterization in early morning hours of  April 23,2009, revealing occluded mid RCA with an inability to  revascularize.   HISTORY OF PRESENT ILLNESS:  A 70 year old Caucasian male prior history  of CAD, status post inferior ST elevation MI in April 2006, with  recurrent inferior MI in May 2007, secondary to stent thrombosis.  At  that time, he was treated with cutting balloon angioplasty.  He was in  his usual state of health until 8:30 a.m. on September 27, 2007, when he had  a recurrent chest pain, shortness breath, diaphoresis, and EMS was  activated.  ECG showed inferior ST-segment elevation and code STEMI was  activated.  He was taken to the [facility] for further  evaluation.   HOSPITAL COURSE:  Diagnostic catheterization on September 27, 2007, showed  an occluded previously placed stent in the distal RCA with otherwise  nonobstructive disease.  This was treated with a 3.0 x 32-mm  Liberte  bare-metal stent.  The patient was maintained on a Integrilin and also  reloaded with Plavix and placed on b.i.d. Plavix and was transferred to  the CCU for continued care.  He initially peaked a CK 217 with an MB of  29.4 and his troponin-I of 2.62.  Unfortunately on the morning of September 28, 2007,  the patient had recurrent chest discomfort and persistent 1-  mm inferior ST-segment elevation.  He was also noted to have up to 20  beats of nonsustained ventricular tachycardia.  The patient was taken  back to the Cath Lab urgently where diagnostic catheterization revealed  thrombus and dissection in the right coronary artery with an occlusion  of proximal to the distal stent.  This was successfully wired and then  restented with a 3.5 x 32-mm Liberte bare-metal stent as well as  a 4.0  x 32-mm Liberte bare-metal stent extending up into the mid RCA.  Again,  the patient was maintained on Integrilin therapy and Thoracic Surgery  was consulted.  Due to concern, the patient would likely restenose the  stents and the patient was seen  by Dr. Tyrone [name] who felt that he may  benefit from right coronary artery bypass.  In the early morning hours  of September 29, 2007, the patient had recurrent chest pain and needed to be  taken back to the Cath Lab urgently.  At this point, the midportion of  the RCA was occluded and the catheterization team was unable to  revascularize.  The patient was maintained on beta blocker and  Integrilin and return to the coronary intensive care unit.  Nicholas [name]  continued to have intermittent chest discomfort and nausea while he  completed his inferior MI.  By September 30, 2007, the patient was feeling  much better.  Despite feeling or despite being symptomatically improved,  his enzymes continued to trend down and by September 30, 2007, his CK was  1194 with an MB of 196.9.  On the evening of October 01, 2007, he was  noted to have AFib with RVR with rates into the 130s.  He  was maintained  on his beta blocker therapy and given additional intravenous beta  blocker.  He converted back to sinus rhythm approximately at 4:00 a.m.  on October 02, 2007.  It was felt that in the setting of current aspirin  and Plavix usage that Coumadin should be avoided at this point.  Mr.  [name] has had no additional AFib and since transfer to the floor has  been ambulating without difficulty.  He is being discharged home today  in good condition.   DISCHARGE LABS:  Hemoglobin 14.6, 41.8, WBC 12.9, and platelet 277.  Sodium 136, potassium 3.8, chloride 98, CO2 29, BUN 25, creatinine 1.02,  glucose 125, calcium 9.4, CK 372, MB 19.0, troponin-I 13.05, total  cholesterol 205, triglycerides 145, HDL 32, LDL 144, and TSH 0.666.   DISPOSITION:  The patient is being discharged home today in good  condition.   FOLLOWUP PLANS AND APPOINTMENTS:  We have arranged followup with Dr.  Rollene [name] in our [facility] office on Oct 21, 2007, at 12:00 p.m.  He is to follow up with Dr. Christell [name] as previously scheduled.   DISCHARGE MEDICATIONS:  1. Aspirin 325 mg daily.  2. Plavix 75 mg daily.  3. Crestor 20 mg daily.  4. Lopressor 50 mg 1/2 tablet b.i.d.  5. Nitroglycerin 0.4 mg sublingual p.r.n. chest pain.   OUTSTANDING LAB STUDIES:  None.   DURATION DISCHARGE ENCOUNTER:  Sixty two minutes including physician  time.      Nicholas [name], ANP      Nicholas Rotunda, MD, [facility]  Electronically Signed    CB/MEDQ  D:  10/04/2007  T:  10/05/2007  Job:  [id number]   cc:   Nicholas [name], M.D.

## 2010-10-21 NOTE — Cardiovascular Report (Signed)
NAME:  Nicholas [name], SAXTON NO.:  [id number]   MEDICAL RECORD NO.:  [id number]          PATIENT TYPE:  OIB   LOCATION:  2807                         FACILITY:  [facility]   PHYSICIAN:  Veverly Fells. Excell Seltzer, MD  DATE OF BIRTH:  18-May-1941   DATE OF PROCEDURE:  DATE OF DISCHARGE:                            CARDIAC CATHETERIZATION   PROCEDURE:  Left heart catheterization, selective coronary angiography,  left ventricular angiography, PTCA and stenting of the right coronary  artery, Angio-Seal of the right femoral artery.   INDICATIONS:  Nicholas [name] is a 70 year old gentleman who had an inferior  MI in 2006 and was part of the Horizons Trial.  He was treated with  Horizon stent in the right coronary artery.  He came in 2007 with a  second acute myocardial infarction secondary to stent thrombosis.  He  was treated with balloon angioplasty.  He has done well over the last 2  years, but presents again today with a recurrent inferior MI.  His chest  pain started at 8:30 a.m. this morning.  He continues to have chest pain  and inferior ST elevation.  He reports that he has been taking his  Plavix regularly.  In the setting of his acute evolving MI, he was  brought directly from the field to the cath lab on an emergent basis.   Risks and indications of the procedure were reviewed with the patient  and informed consent was obtained.  The right groin was prepped, draped,  anesthetized with 1% lidocaine.  Using modified Seldinger technique, a 6-  French sheath was placed in the right femoral artery.  Standard 6-French  Judkins catheters were used for coronary angiography.  A 6-French JR-4  guide catheter was directly inserted in the right coronary artery in  anticipation of PCI.  A 6-French right femoral venous sheath was also  placed using the modified Seldinger technique prior to PCI.  The initial  right coronary angiogram demonstrated total occlusion of the vessel in  the stented  segment.  The patient was given 4000 units of heparin,  Integrilin double bolus and drip was started.  Once therapeutic ACT was  achieved, a Cougar guidewire was used to cross the lesion and placed in  the distal right coronary artery.  The lesion was crossed with a mild  amount of difficulty and the wire was ultimately placed in the  posterolateral branch.  The occlusion was predilated with a 2.0 x 20 mm  Maverick balloon which was taken to 8 atmospheres on multiple  inflations.  Following 4 inflations with that balloon, there was  restoration of flow.  There was a heavy thrombus burden throughout the  stented segment.  There was minimal flow in the PDA and TIMI II flow in  the posterolateral branch.  At that point, I attempted to pass a 3 x 20  balloon and it would not pass.  I then elected to pass a 2.5 x 9 mm  balloon and we are able to cross the lesion.  Six dilatations were  performed up to 14 atmospheres.  At that point, flow  was improved.  There continued to be a good deal of residual thrombus in the vessel.  The flow was improved at TIMI III.  The PDA was then visualized, but  there was total occlusion of the distal portion of the PDA.  I was able  to cross the PDA occlusion with the wire and I performed 2 balloon  dilatations with a 2.0 x 20 mm balloon up to 4 and 2 atmospheres  distally.  This did not restore flow in the PDA.  A 3.0 x 20 Maverick  balloon was then taken up to 14 atmospheres on 3 inflations.  There was  improvement in flow, but a great deal of residual thrombus persisted.  At that point, I elected to stent the vessel as I thought this would be  the only way to obtain a reasonable result.  A 3.0 x 32 mm Liberte stent  was carefully positioned to cover the entire previous stent and it was  deployed at 18 atmospheres.  The stent was well expanded and there was  TIMI III flow with no residual thrombus in the stented segment following  stenting.  The stent was then  postdilated with a 3.5 x 20 mm Quantum  Maverick balloon taken to 16 atmospheres over the distal portion of the  stent and 20 atmospheres over the proximal portion of the stent.  At the  completion of the procedure, there was an excellent angiographic result  with a widely patent stent in the distal portion.  There were 2 branches  1 of which was the PDA that had downstream occlusion likely secondary to  distal embolization.  The PL branches were widely patent.  There was  TIMI III flow in the patent vessels.  The patient tolerated the  procedure well and had no immediate complications.  Pigtail catheter was  then inserted into the left ventricle and ventriculography was  performed.  A pullback across the aortic valve was done and Angio-Seal  device was used to close the femoral arteriotomy.  The patient tolerated  the procedure well.  There were no immediate complications.   FINDINGS:  Aortic pressure 126/72 with a mean of 94.  The LV pressure  129/16.   Coronary angiography:  Left mainstem has diffuse luminal irregularities  with no significant stenoses.  It bifurcates into the LAD and left  circumflex.   The LAD is a large-caliber vessel that is moderately calcified.  It is  an ectatic vessel with somewhat sluggish flow.  There is diffuse 50-60%  stenosis throughout the proximal LAD into the midportion of the vessel.  The tightest lesion is at the first septal perforator and it is 60%  stenosed.  There are 3 diagonal branches from the midportion of the LAD  all of which are patent.  There are multiple septal perforator branches.  There are no severe stenoses throughout the vessel.   Left circumflex:  The left circumflex is patent.  There are diffuse  luminal irregularities present.  There is a small first OM and a large  second OM branch.  The second OM has a 40% stenosis in its proximal  portion.  The AV groove circumflex is small and supplies  an atrial  branch.   Right  coronary artery:  The right coronary artery is a dominant vessel.  It is tortuous and ectatic.  The proximal vessel has 40-50% stenosis.  There is a flush occlusion of the distal vessel in the proximal aspect  of the previously  placed stent.  Following revascularization,  there is  a PDA branch that is totally occluded and 2 posterolateral branches that  are patent.   Left ventriculography shows basal inferior akinesis with anteroapical  hypokinesis.  The LVEF is reduced and is estimated at 40%.   ASSESSMENT:  1. Inferior myocardial infarction secondary to right coronary artery      stent thrombosis.  2. Moderate diffuse left anterior descending stenosis.  3. Nonobstructive left circumflex disease.  4. Moderate left ventricular dysfunction.  5. Successful percutaneous coronary intervention of the right coronary      artery using a Liberte bare-metal stent.   PLAN:  Recommend aspirin, Plavix and Integrilin.  We will treat the  patient for at least 18 hours with Integrilin.  He will require ongoing  aggressive medical therapy.  I would put him on 75 mg of Plavix twice  daily since he is at such high risk of recurrent stent thromboses.      Veverly Fells. Excell Seltzer, MD  Electronically Signed     MDC/MEDQ  D:  09/27/2007  T:  09/27/2007  Job:  [id number]

## 2010-10-21 NOTE — Assessment & Plan Note (Signed)
[facility] HEALTHCARE                                 ON-CALL NOTE   [name], Nicholas [name]                      MRN:          [id number]  DATE:10/08/2007                            DOB:          07-22-1940    PRIMARY CARDIOLOGIST:  Rollene Rotunda, MD, [facility].   PRIMARY CARE PHYSICIAN:  [name], M.D.   SUMMARY OF HISTORY:  Nicholas [name] is a 70 year old male who was recently  discharged from the hospital on October 04, 2007 after undergoing acute  inferior ST elevation myocardial infarction with bare metal stenting to  the RCA.  However, his stay was complicated by reinfarction several  times, with several catheterizations for relook.  Nicholas [name] calls  today, stating that he has felt fine since discharge until this morning.  He states that he just feels like he does not have any energy.  He  denies any actual chest discomfort or shortness of breath.  He states  that he took his blood pressure which was the first time he checked  since being discharged from the hospital, and his blood pressure was  94/67.  He did notice on one occasion dizziness with standing up, but  this quickly resolved.  He was wondering what to do.   Looking him up in e-chart, looking through his hospital course, his  blood pressure was usually in the 90s and low 100s.  So, I explained to  him this did not represent a significant change for him.  Also reviewed  his medications.  Prior to admission, he was on Toprol, and currently he  is on Lopressor at a similar dosing.  I explained to him we would be  happy to evaluate him if he felt that he needs to be evaluated at the  [facility] Emergency Room.  However, I also suggested that he could continue  to monitor his blood pressure, and I informed him on how to perform  baseline orthostatic blood pressures that he could continue to monitor.  He felt that he would like to continue to monitor his blood pressure.  I  explained to him if his blood  pressure dropped below 80 or his pulse  below 50, or he develops significant chest discomfort, dizziness that  did not resolve, or shortness of breath, that he should call us back and  we would need to evaluate him in the emergency room.  Otherwise, he will  keep his appointment with Dr. [name] on Oct 21, 2007 and bring his  blood pressure diary with him.     Joellyn Rued, PA-C  Electronically Signed      Bevelyn Buckles. Bensimhon, MD  Electronically Signed   EW/MedQ  DD: 10/08/2007  DT: 10/08/2007  Job #: [id number]   cc:   Rollene Rotunda, MD, [facility]  [name], M.D.

## 2010-10-21 NOTE — Assessment & Plan Note (Signed)
[facility] HEALTHCARE                            CARDIOLOGY OFFICE NOTE   NAME:[name], [name]                      MRN:          [id number]  DATE:02/23/2007                            DOB:          [birth year]    PRIMARY CARE PHYSICIAN:  [facility].   REASON FOR PRESENTATION:  Evaluate patient with coronary disease.   HISTORY OF PRESENT ILLNESS:  Patient is now 70 years old.  He has done  well over the past year.  He has had no chest discomfort.  He remains  very active, working 15 hours a day.  He denies any neck or arm  discomfort.  He has had no palpitations, presyncope or syncope.  He has  had no PND or orthopnea.  He has had little itching in his fingers which  he wonders might be the Plavix.  He has seen a dermatologist.  There is  a slight rash there.   PAST MEDICAL HISTORY:  1. Coronary artery disease (status post ST elevation myocardial      infarction April 2006, with an occluded right coronary artery      requiring stenting.  He was in the HORIZON study.  He had a      secondary to myocardial infarction Oct 31, 2004, with in-stent      restenosis and transluminal angioplasty and cutting balloon).  2. Hypertension.  3. Hyperlipidemia.  4. Prostate cancer, status post resection April 2005.  5. Constipation.  6. Hemorrhoids.   ALLERGIES:  NONE.   MEDICATIONS:  1. Plavix 75 mg daily.  2. Aspirin 81 mg daily.  3. Toprol 25 mg daily.  4. Multivitamin.  5. Pravastatin 40 mg nightly.  6. Omega III fatty acid.   REVIEW OF SYSTEMS:  As stated in the HPI and otherwise negative for  other systems.   PHYSICAL EXAMINATION:  VITAL SIGNS:  Blood pressure 120/88, heart rate  64 and regular, weight 190 pounds, body mass index 28.  GENERAL APPEARANCE:  Patient is in no distress.  HEENT:  Eye lids unremarkable.  Pupils are equal, round and reactive to  light.  Fundi not visualized.  Oral mucosa unremarkable.  NECK:  No jugular  venous distension at 45 degrees.  Carotid upstrokes  brisk and symmetric, no bruits, no thyromegaly.  LYMPHATICS:  No cervical, axillary or inguinal adenopathy.  CHEST:  Unremarkable.  LUNGS:  Clear to auscultation bilaterally.  BACK:  No costovertebral angle tenderness.  CARDIOVASCULAR:  PMI not displaced or sustained.  S1 and S2 within  normal limits.  No S3, no S4, no clicks, no rubs, no murmurs.  ABDOMEN:  Flat, positive bowel sounds, normal in frequency and pitch, no  bruits, no rebound, no guarding, no midline pulsatile mass, no  hepatomegaly, no splenomegaly.  SKIN:  No rashes, no nodules.  EXTREMITIES:  Pulses 2+ throughout, no clubbing, cyanosis, or edema.  NEUROLOGIC:  Oriented to person, place and time.  Cranial nerves II-XII  grossly intact.  Motor grossly intact.   EKG with sinus rhythm, rate 64, axis within normal limits, old inferior  infarct, early transition  lead V2, questionable old posterior wall  involvement of the infarct, no acute STT wave changes.   ASSESSMENT/PLAN:  1. Coronary disease.  Patient is doing well.  No further      cardiovascular testing is suggested.  He will continue with      secondary risk reduction.  2. Hypertension.  Blood pressure is well controlled and he will      continue the medications as listed.  3. Dyslipidemia.  He has had good lipid control.  I will check a lipid      profile as it has been one year.  4. Medications.  I would continue the Plavix indefinitely given the      recurrent stenosis in his previous stent.  5. Follow-up.  Will see the patient back again in one year or sooner      if needed.     Rollene Rotunda, MD, [facility]  Electronically Signed    JH/MedQ  DD: 02/23/2007  DT: 02/24/2007  Job #: [id number]   cc:   [facility]

## 2010-10-21 NOTE — Consult Note (Signed)
NAME:  Nicholas [name], Nicholas [name] NO.:  [id number]   MEDICAL RECORD NO.:  [id number]          PATIENT TYPE:  OIB   LOCATION:  2903                         FACILITY:  [facility]   PHYSICIAN:  Sheliah Plane, MD    DATE OF BIRTH:  [birth year]   DATE OF CONSULTATION:  DATE OF DISCHARGE:                                 CONSULTATION   REQUESTING PHYSICIAN:  Dr. Excell Seltzer.   FOLLOWUP CARDIOLOGIST:  Bevelyn Buckles. Bensimhon, MD   REASON FOR CONSULTATION:  Inferior myocardial infarction.   HISTORY OF PRESENT ILLNESS:  The patient is a 70 year old male who has a  known history of cardiac disease, presented with acute substernal chest  pain approximately 8:00 a.m. 36 hours ago with syncopal episode, then  developed nausea, vomiting and was brought by EMS with STEMI, inferior  myocardial infarction to the [facility] Emergency Room.  Immediate cardiac  catheterization was done by Dr. Excell Seltzer where additional stents and  opening of thrombosed stent in the right coronary artery was carried  out.  The patient has a previous history of inferior myocardial  infarction in 2006, when stent was placed in the right coronary artery.  He had been maintained on Plavix, until he was diagnosed with prostate  cancer in 2007.  Plavix was stopped, several weeks related to his  prostate surgery and several days after restarting and again thrombosed  his right coronary stents and had additional angioplasty and stent  placement done.  Since that time until symptoms yesterday, he had been  doing well.   Again this morning, he had a recurrent episodes of substernal chest pain  and repeat catheterization was done which shows total occlusion of the  distal right coronary artery.  Currently, the patient is on Integra line  was given a double dose of Plavix today and is pain free.  Cardiac risk  factors, the patient denies hypertension, has known hyperlipidemia.  Denies diabetes.  He is a remote smoker, smoked for 40 years but  quit 18  years ago.   FAMILY HISTORY:  He has a strong family history of cardiac disease.  Father died at age 48 of myocardial infarction also had laryngeal cancer  and a tracheostomy.  Mother died at age 73 of small cell lung cancer.  He has two sisters and two brothers, one sister with mitral valve  prolapse, one brother was operated on at [facility] for acute myocardial  infarction and an aneurysm but the details of this are not known.  The  patient denies claudication.  Denies stroke.  Denies renal  insufficiency.  Baseline creatinine 0.9.   PAST MEDICAL HISTORY:  Prostate cancer, underwent prostatectomy in 2007  by Dr. Ma Rings.   SOCIAL HISTORY:  The patient is married.  Currently retired from  [employer], now works part-time doing tile work and also tree trimming.   Currently, he is on Integrilin.   MEDICATIONS ON ADMISSION:  1. Toprol-XL 25 mg a day.  2. Plavix 75 mg a day.  3. Pravachol 40 mg daily.  4. Aspirin 81 mg a day.  5. Multivitamins and fish oil.  ALLERGIES:  Drug allergies, none known.  The patient has had a chronic  rash thought possibly related to the PLAVIX.   REVIEW OF SYSTEMS:  Cardiac review of systems is positive for chest pain  and syncope.  Denies orthopnea and presyncope.  Exertional shortness of  breath, resting shortness of breath, palpitation, and lower extremity  edema.  General review of systems, the patient denies any constitutional  symptoms.  Denies hemoptysis.  Denies any change in bowel habits.  Denies any blood in his stool.  Neurologic, no history of TIAs.  GU, as  noted above.  No urinary problems.  Currently, no incontinence.  He does  have chronic skin bruising and rash.  He has been seeing a dermatologist  without any specific diagnosis.  Currently, his blood pressure 115/69,  heart rate 87, O2 sats 100%, and respiratory rate is 15.  The patient is  5 feet 9 inches tall, 187 pounds.  The patient is awake, alert, and  neurologically intact  and not uncomfortable or having chest pain  currently.  He has no carotid bruits.  Lungs are clear bilaterally.  Cardiac exam reveals regular rate and rhythm without murmur or gallop.  Abdominal exam is benign without palpable masses or tenderness.  The  right leg cast site is without significant hematoma with two puncture  sites.  Lower extremities without edema, +2 DP, and PT pulses  bilaterally.  Appears to have adequate vein for bypass.   LABORATORY FINDINGS:  CK-MB of 10.5 and a troponin of 0.6, further  pending.  Hematocrits 39, white count 7.1, and platelet count 209.  Creatinine 0.9.   Cardiac catheterization films from the 21st and 22nd are reviewed.  The  patient has moderate disease in his LAD, possibly hepatic artery with a  60-70% stenosis.  The right coronary artery is filled with stents, the  distal end of the stented artery is occluded.  Yesterday's film, there  appeared to be several distal right branches that were large enough for  bypass.   IMPRESSION:  The patient status post myocardial infarction with stent  thrombosis third time with severe one-vessel disease and moderate two-  vessel disease.   SUGGESTIONS:  Currently, the patient would have 2 options; continued  medical therapy versus coronary artery bypass grafting to two branches  of the right and the mammary to his LAD.  With his current situation, it  is very hard to predict which of these two options will get the best  outcome.  The patient has had significant damage to his inferior heart  but probably has viable myocardium there.  At this point, we should hold  his Plavix, continue him on Integrilin and wait for a period of Plavix  washout and consider bypass surgery in 6-7 days.  I will review the  films with Dr. Excell Seltzer in cardiology before making a final decision.      Sheliah Plane, MD  Electronically Signed     EG/MEDQ  D:  09/28/2007  T:  09/29/2007  Job:  [id number]   cc:   Veverly Fells.  Excell Seltzer, MD

## 2010-10-21 NOTE — Assessment & Plan Note (Signed)
[facility]                            CARDIOLOGY OFFICE NOTE   NAME:[name], [name]                      MRN:          [id number]  DATE:10/21/2007                            DOB:          11-05-1940    PRIMARY:  Nicholas [name], M.D.   REASON FOR PRESENTATION:  Evaluate patient with recent hospitalization  for myocardial infarction.   HISTORY OF PRESENT ILLNESS:  The patient is a pleasant 70 year old  gentleman was admitted on 04/21 with an acute inferior myocardial  infarction.  The patient had had an inferior infarct in the past.  In  April of 2006 he had been treated with a stent.  Thirteen months later,  after being off Plavix for 1 month, he had recurrent chest discomfort  with an inferior ST elevation InStent thrombosis.  He was treated with  cutting balloon angioplasty.  He had recurrent chest discomfort on the  day of admission.  He had a cardiac catheterization demonstrating that  the LAD had 50%-60% stenosis in the proximal midportion.  The circumflex  had an OM-1 with 40% stenosis.  The right coronary artery had proximal  40%-50% stenosis followed by occlusion in the previously placed stent.  The patient subsequently had a bare metal stent placed.  However, the  morning following this he had reocclusion and was taken back to the cath  lab.  At this point, it was decided to manage him medically.  The  patient had some recurrent discomfort and significant enzyme elevation.  He had some brief atrial fibrillation with rapid ventricular rate.  Over  the course of his hospitalization he subsequently became asymptomatic.  He was breathing well.  He was no longer having chest discomfort.  He  had no recurrent atrial arrhythmias.   At home, he was somewhat lightheaded, but was taking 75 mg of  metoprolol.  This was written on his pink discharge sheet, but not in  the discharge dictation.  He was to be taking 25 mg twice a day.  He  called the  office, yesterday, reporting low blood pressures; and had  actually been taking 25 mg twice a day for the last few days.  He has  felt much better on this dose of metoprolol.  He has had no chest  pressure, neck or arm discomfort.  He has had no palpitation,  presyncope, or syncope.  He has had no PND nor orthopnea.   PAST MEDICAL HISTORY:  Coronary artery disease as described,  hypertension, hyperlipidemia, prostate cancer status post resection in  April 2005, constipation, hemorrhoids.   ALLERGIES:  None.   MEDICATIONS:  1. Plavix 75 mg daily.  2. Multivitamin.  3. Pravastatin 40 mg nightly.  4. Fish oil, omega 3.  5. Crestor 20 mg daily.  6. Toprol 25 mg b.i.d.  7. Aspirin 325 mg daily.   REVIEW OF SYSTEMS:  As stated in the HPI, otherwise negative for other  systems.   PHYSICAL EXAMINATION:  The patient is in no distress.  Blood pressure 112/71, heart 74 and regular, weight 185 pounds.  HEENT:  Eyelids are  unremarkable.  Pupils equal, round, and reacted to  light.  Fundi not visualized.  Oral mucosa unremarkable.  NECK:  No jugular distention 45 degrees.  Carotid upstroke brisk and  symmetric.  No bruits, no thyromegaly.  LYMPHATICS:  No cervical, axillary, or inguinal adenopathy.  LUNGS:  Clear to auscultation bilaterally.  BACK:  No costovertebral angle tenderness.  CHEST:  Unremarkable.  HEART:  PMI not displaced or sustained, S1 and S2 within normal limits,  no S3, no S4, no clicks, no rubs, no murmurs.  ABDOMEN:  Flat, positive bowel sounds normal in frequency and pitch.  No  bruits, no rebound, no guarding, no midline pulsatile mass.  No  hepatomegaly or splenomegaly.  SKIN:  No rashes.  EXTREMITIES:  2+ pulses throughout, no edema, no cyanosis, no clubbing.  NEURO:  Oriented to person, place, and time.  Cranial nerves II-XII  grossly intact, motor grossly intact.   EKG:  Sinus rhythm, rate 77, axis within normal limits, intervals within  normal limits, old  inferior infarct with T-wave inversions in the  inferior leads.   ASSESSMENT/PLAN:  1. Coronary disease.  The patient has coronary disease with now an      occluded right coronary.  He is having no further symptoms.  He      will continue to be managed medically.  Of note, he was taking a      higher dose of beta blocker, and was quite symptomatic with this.      He will remain on the current 25 mg twice a day of metoprolol.  He      will also remain on aspirin and Plavix.  2. Dyslipidemia.  He was taking with pravastatin and Crestor.  I      clarified that he should be on the Crestor alone.  He was given      instructions to get a lipid profile and liver enzymes in 8 weeks.  3. Hypertension.  His blood pressure is certainly not elevated.  He      will continue medicines as listed.   FOLLOWUP:  I will see him back in about 4 months in [place].Rollene Rotunda, MD, [facility]  Electronically Signed    JH/MedQ  DD: 10/21/2007  DT: 10/21/2007  Job #: [id number]   cc:   Nicholas [name], M.D.

## 2010-10-24 NOTE — Assessment & Plan Note (Signed)
[facility]                                 ON-CALL NOTE   NAME:Nicholas [name], Nicholas [name]                        MRN:          [id number]  DATE:04/16/2007                            DOB:          October 02, 1940    CARDIOLOGIST:  Rollene Rotunda, MD, [facility].   PHONE NUMBER:  [phone].   HISTORY:  Nicholas [name] is a 70 year old male patient followed by Dr.  [name] with a history of coronary disease, status post inferior ST  elevation myocardial infarction in April 2006, treated with stenting to  the RCA and subsequent myocardial infarction in May 2006, secondary to  in-stent restenosis treated with angioplasty and cutting-balloon.  He  has developed some congestion and sore throat over the last 24 hours.  He called the answering service to find out what he could take.  He  denies any fever or chills.  He has had a nonproductive cough.  He  denies any chest heaviness or tightness suggestive of his angina.  He  denies any syncope.   PLAN:  I explained to the patient and his wife that he could use Tylenol  for pain and discomfort.  He can use throat lozenges as well.  He can  use warm salt water gargles.  He can also get over-the-counter Mucinex  DM to take.  I explained to him that if he develops any fever or chills  or worsening cough over the weekend he should be seen.   DISPOSITION:  As noted above.      Tereso Newcomer, PA-C  Electronically Signed      Rollene Rotunda, MD, [facility]  Electronically Signed   SW/MedQ  DD: 04/16/2007  DT: 04/17/2007  Job #: [id number]

## 2010-10-24 NOTE — Discharge Summary (Signed)
NAME:  Nicholas [name], Nicholas [name]                         ACCOUNT NO.:  [id number]   MEDICAL RECORD NO.:  [id number]                   PATIENT TYPE:  INP   LOCATION:  0373                                 FACILITY:  [facility]   PHYSICIAN:  Excell Seltzer. Annabell Howells, M.D.                 DATE OF BIRTH:  Sep 01, 1940   DATE OF ADMISSION:  09/20/2003  DATE OF DISCHARGE:  09/22/2003                                 DISCHARGE SUMMARY   ADMISSION DIAGNOSIS:  Prostate cancer.   PROCEDURE:  Radical retropubic prostatectomy with bilateral pelvic lymph  node dissection performed September 20, 2003.   DISCHARGE MEDICATIONS:  Vicodin and Levaquin.   DISCHARGE DIET:  Regular.   ACTIVITY LEVEL:  The patient should avoid bending, lifting, driving, or  moving heavy objects.   FOLLOWUP:  The patient will follow up with Dr. Annabell Howells in one week.   BRIEF HISTORY OF PRESENT ILLNESS:  [name] is a 70 year old male who  underwent prostate biopsy for an elevated PSA value of 4.9. The prostate  biopsy demonstrated a prostate adenocarcinoma in the left needle biopsy  consistent with a Gleason's score of 3+3=6.  Several therapies were  discussed the patient including watchful waiting, surgical intervention as  well as external beam and seed implantation. [name] has elected to  proceed with radical retropubic prostatectomy.   HOSPITAL COURSE:  [name] was admitted to [facility] on September 20, 2003, and taken to the operating room at which time he underwent a  radical retropubic prostatectomy and bilateral pelvic lymph node dissection.  The patient tolerated the procedure well and postoperatively was transferred  to the PACU in stable condition. For a detailed description of the operation  please see the typed operative note in the chart. The patient did well  overnight and on postoperative day one remained afebrile and hemodynamically  stable. The patient's urine output on postoperative day one was adequate and  his JP  output was approximately 65 cc. His hemoglobin on postoperative day  one was 11.5.  The patient was subsequently advanced to a regular diet and  appears to ambulate in the hallway which she performed without difficulty.  On postoperative day two, the patient continued to do well. The patient was  able to have a bowel movement overnight. The patient's JP drain was  subsequently removed and the patient was discharged to home in stable  condition.  The patient will follow up with Dr. Annabell Howells in approximately one  week.     J.C. Vear Clock, MD                         Excell Seltzer. Annabell Howells, M.D.    JP/MEDQ  D:  10/23/2003  T:  10/23/2003  Job:  [id number]

## 2010-10-24 NOTE — H&P (Signed)
NAME:  Nicholas [name], Nicholas [name] NO.:  [id number]   MEDICAL RECORD NO.:  [id number]          PATIENT TYPE:  INP   LOCATION:  1823                         FACILITY:  [facility]   PHYSICIAN:  Arvilla Meres, M.D. LHCDATE OF BIRTH:  10-28-1940   DATE OF ADMISSION:  10/31/2005  DATE OF DISCHARGE:                                HISTORY & PHYSICAL   PRIMARY CARDIOLOGIST:  Rollene Rotunda, M.D.   PRIMARY CARE PHYSICIAN:  Montey Hora, P.A.-C, at [facility].   CHIEF COMPLAINT:  Chest pain.   HISTORY OF PRESENT ILLNESS:  Mr. Trull is a 70 year old male with a history  of coronary artery disease.  He had onset of substernal chest pain today  with exertion which started about noon.  His symptoms initially resolved but  came back and were unrelenting at approximately 1:00 p.m.  The pain at 1  o'clock was associated with nausea, vomiting, diaphoresis and shortness of  breath, reaching 10/10.  EMS was called and transported him to [facility].  En route, he was given sublingual nitroglycerin x1 as well as 6 mg of  morphine.  In the hospital emergency room, he was given aspirin 81 mg x4 as  well as 1 mg of Dilaudid and started on IV heparin.  His chest pain is  ongoing.  After receiving the medications, it is currently a 4/10.  He is  being taken urgently to the catheterization lab.   PAST MEDICAL HISTORY:  1.  Inferior ST elevation myocardial infarction with totally occluded distal      right coronary artery in April 2006, status post PTCA and Horizon stent.  2.  Residual coronary artery disease with 50-60% in the LAD, 20% circumflex      and  50% in the OM-2.  3.  Ischemic cardiomyopathy with an EF of 45-50% at catheterization in 2006.  4.  Hyperlipidemia  5.  Remote history of tobacco use, ongoing tobacco chewing.  6.  Family history of coronary artery disease.  7.  History of prostate cancer.  8.  History of constipation and hemorrhoids.   PAST SURGICAL  HISTORY:  1.  Status post cardiac catheterization.  2.  Status post radical retropubic prostatectomy.   ALLERGIES:  No known drug allergies although he has had a rash recently  which got better when he held his LIPITOR.   CURRENT MEDICATIONS:  1.  Toprol XL 50 mg a day.  2.  Aspirin 91 mg a day.  3.  Lipitor 20 mg a day.   SOCIAL HISTORY:  He lives in [place] with his wife and works as a  Games developer.  He quit tobacco 14 years ago but chews about 1/2 pack  per day.  He does not abuse alcohol or drugs.   FAMILY HISTORY:  His mother died in her 69s of cancer without heart disease.  His father had cancer but also died of an MI.  He has one brother who has a  history of coronary artery disease.   REVIEW OF SYSTEMS:  He has occasional arthralgias.  He has occasional red  blood in his stools.  Chest pain, shortness of breath as described above.  Review of systems is otherwise negative.   PHYSICAL EXAMINATION:  VITAL SIGNS:  Temperature is 96.4, blood pressure  135/69, pulse 69, respiratory rate 16, O2 saturation 100% on 2 liters.  GENERAL:  He is a well-developed middle-aged white male in obvious distress.  HEENT:  His head is normocephalic and atraumatic with pupils equal, round,  reactive to light and accommodation.  Extraocular movements intact.  Sclera  clear.  Nares without discharge.  NECK:  There is no lymphadenopathy, thyromegaly, bruit or JVD noted.  CARDIOVASCULAR:  His heart is regular in rate and rhythm and no significant  murmur or gallop is noted.  LUNGS:  He has a few rales in his bases but otherwise clear.  SKIN:  He has some resolving rashes on both of his hands and forearms.  ABDOMEN:  Soft and nontender with active bowel sounds.  EXTREMITIES:  There is no cyanosis, clubbing or edema noted.  Distal pulses  are 2+ in all four extremities and no femoral bruits are appreciated.  MUSCULOSKELETAL:  There is no joint deformity or effusions.  NEUROLOGICAL:  He is  alert and oriented.  Cranial nerves II-XII grossly  intact.   LABORATORY DATA:  Chest x-ray is pending at the time of dictation but has  been performed.   EKG shows inferior ST elevation similar to an EKG in April 2006.   IMPRESSION:  Probable acute inferior ST elevation myocardial infarction.   PLAN:  He will be taken upstairs for an urgent catheterization.  He was  loaded with Plavix in the emergency room at 300 mg.  Risk factor reduction  will be pursued.   Dr. Nicholes Mango saw the patient and determined the plan of care.      Theodore Demark, P.A. [facility]      Arvilla Meres, M.D. [facility]  Electronically Signed    RB/MEDQ  D:  10/31/2005  T:  10/31/2005  Job:  [id number]

## 2010-10-24 NOTE — Cardiovascular Report (Signed)
NAME:  Nicholas [name], Nicholas [name] NO.:  [id number]   MEDICAL RECORD NO.:  [id number]          PATIENT TYPE:  INP   LOCATION:  1825                         FACILITY:  [facility]   PHYSICIAN:  Arvilla Meres, M.D. LHCDATE OF BIRTH:  Aug 29, 1940   DATE OF PROCEDURE:  09/19/2004  DATE OF DISCHARGE:                              CARDIAC CATHETERIZATION   PRIMARY CARE PHYSICIAN:  Dr. Vernon Prey   CARDIOLOGIST:  Dr. Olga Millers   He is new to [facility].   PATIENT IDENTIFICATION:  Nicholas [name] is a 70 year old male with a history of  hypertension, hyperlipidemia, and remote tobacco use, but no known coronary  disease who was brought to the catheterization laboratory emergently for an  inferior ST elevation MI.   PROCEDURES PERFORMED:  1.  Left heart catheterization.  2.  Left ventriculogram.  3.  Selective coronary angiography.   DESCRIPTION OF PROCEDURE:  The risks and benefits of catheterization were  reviewed with Nicholas [name].  A consent was signed and placed on the chart.  The right groin area was prepped and draped in routine sterile fashion and  then anesthetized with 1% local lidocaine.  A 6-French arterial sheath was  placed in the right femoral artery using a modified Seldinger technique.  Standard catheters were used throughout the procedure including preformed  JR4, JL4, and angled pigtail.  All catheter exchanges were made over a wire.  There were no apparent complications.  At the end of the diagnostic  catheterization Dr. Charlies Constable took over for planned percutaneous  intervention on a totally occluded right coronary artery.   FINDINGS:  Central aortic pressure 167/104 with a mean of 135.  LV was  174/15 with an LVEDP of 24.  There was no gradient on pullback across the  aortic valve.   CORONARIES:  Left main was angiographically normal.   LAD was a moderate sized vessel with some ectasia in the mid vessel.  It  gave off three diagonals and several  septal perforators.  There was a 50-60%  lesion in the proximal vessel prior to the takeoff of the first diagonal.  After the first diagonal there was also a 40% lesion in the LAD followed by  a 30% lesion.  In the mid to distal vessel there was a 50% lesion near the  takeoff of third diagonal followed by a long 30% lesion in the distal LAD.   The left circumflex was a large vessel.  Gave off a small OM1, a large  branching OM2, and a small OM3.  The distal AV groove circumflex was a tiny  vessel, but did give off the nodal artery.  In the proximal circumflex there  is a 20% tubular lesion.  In the proximal portion of a large OM2 there was a  50% tubular lesion.  There is no flow-limiting stenosis.   The right coronary artery was a very large ectatic vessel.  The vessel was  totally occluded distally prior to the takeoff of the PDA.   Left ventriculogram done in the RAO approach showed EF of 45-50% with  akinesis of the  inferobasilar wall.  The mid to apical inferior wall was  hypokinetic.  There was no mitral regurgitation.  On panning down across the  abdominal aorta after the ventriculography there was no evidence of AAA or  significant abdominal aortic or iliac disease.   ASSESSMENT:  1.  Inferior ST elevation myocardial infarction with totally occluded distal      right coronary artery.  2.  Ejection fraction of about 45-50% with inferior akinesis/hypokinesis.  3.  Borderline disease in the left anterior descending.   PLAN:  1.  Plan for percutaneous intervention of the right coronary artery with Dr.      Charlies Constable.  2.  Consider outpatient Cardiolite to assess the significance of LAD      lesions.  3.  He will need aggressive risk factor management.      DB/MEDQ  D:  09/19/2004  T:  09/19/2004  Job:  [id number]   cc:   [name], M.D.  [address] [place]  [place] [address]  Fax: [phone]

## 2010-10-24 NOTE — H&P (Signed)
NAME:  ALEJANDRA, BARNA NO.:  [id number]   MEDICAL RECORD NO.:  [id number]          PATIENT TYPE:  INP   LOCATION:  1825                         FACILITY:  [facility]   PHYSICIAN:  Olga Millers, M.D. LHCDATE OF BIRTH:  [birth year]   DATE OF ADMISSION:  09/19/2004  DATE OF DISCHARGE:                                HISTORY & PHYSICAL   Mr. Antillon is a 70 year old male with no known cardiac history, but with  cardiac risk factors notable for hypertension, history of  hypercholesterolemia, and remote tobacco, age, gender, who presents to the  emergency room via EMS with acute inferior myocardial infarction.   The patient developed chest pain at approximately 2 p.m. earlier today.  He  reported associated dyspnea and diaphoresis, but no radiation.  In route, he  developed nausea and vomiting.   On admission, electrocardiogram reveals marked ST elevation in the inferior  leads.  The patient has been treated with aspirin, sublingual nitroglycerin,  intravenous nitroglycerin and heparin.  He is currently hemodynamically  stable.   ALLERGIES:  No known drug allergies.   MEDICATIONS PRIOR TO ADMISSION:  Aspirin 325 mg daily.   PAST MEDICAL HISTORY:  Hypertension, hypercholesterolemia, prostate cancer  status post resection April 2005.   SOCIAL HISTORY:  The patient has not smoked tobacco for 14 years.  Denies  alcohol use.   FAMILY HISTORY:  Father deceased age 33 secondary to myocardial infarction.   REVIEW OF SYSTEMS:  Denies hemoptysis, hematuria, hematochezia, or melena.  Remaining systems negative.   PHYSICAL EXAMINATION:  VITAL SIGNS:  Blood pressure 140/72, pulse 72  regular, SAO2 99 (100%).  GENERAL:  A 70 year old male in no apparent distress.  HEENT:  Normocephalic, atraumatic.  NECK:  Bilateral carotid pulses without bruits.  LUNGS:  Clear to auscultation in all fields.  HEART:  Regular rate and rhythm (S1, S2).  No significant murmurs.  ABDOMEN:   Benign.  EXTREMITIES:  Intact distal pulses without edema.  NEURO:  No focal deficit.   ELECTROCARDIOGRAM:  Normal sinus rhythm with at least 3-mm ST elevation in  leads II, III, and aVF.  A reciprocal ST depression in aVL, V1, and V2.   IMPRESSION:  1.  Acute inferior myocardial infarction.  2.  Hypertension.  3.  History of hypercholesterolemia.  4.  History of tobacco.   PLAN:  The patient will be taken directly to the cardiac catheterization lab  for emergent percutaneous intervention.  He will be continued on aspirin,  intravenous nitroglycerin and heparin.  He will be started on Lipitor 80  daily and beta blocker and ACE inhibitor will be added prior to discharge.      GS/MEDQ  D:  09/19/2004  T:  09/19/2004  Job:  [id number]   cc:   [name], M.D.  [address] [place]  [place] [address]  Fax: [phone]

## 2010-10-24 NOTE — Discharge Summary (Signed)
NAME:  Nicholas [name], Nicholas [name]                         ACCOUNT NO.:  [id number]   MEDICAL RECORD NO.:  [id number]                   PATIENT TYPE:  INP   LOCATION:  2010                                 FACILITY:  [facility]   PHYSICIAN:  Leighton Roach McDiarmid, M.D.             DATE OF BIRTH:  [birth year]   DATE OF ADMISSION:  08/15/2003  DATE OF DISCHARGE:  08/17/2003                                 DISCHARGE SUMMARY   DISCHARGE DIAGNOSIS:  Community-acquired pneumonia.   DISCHARGE MEDICATIONS:  1. Avelox 400 mg p.o. daily for 12 more days.  2. Aspirin 325 mg p.o. daily.  3. Ibuprofen 600 mg p.o. every six hours p.r.n. for pain.  4. Incentive spirometry four blows every one to two hours.   ACTIVITY:  As tolerated.   DIET:  Low fact.   DISPOSITION:  The patient was discharged to home.   FOLLOWUP:  Follow up with Dr. Christell Constant at [facility] in  one to two weeks.   REVIEW OF HISTORY:  This is a 70 year old male who presented with a two-day  history of fever and left-sided chest pain radiating to the back associated  with shortness of breath and fever of 102 degrees.  The chest pain was noted  to be aggravated with coughing and deep inspiration.  The patient had nausea  and presented to her primary M.D.'s office.  He was then transferred to the  [facility] ED for further management.   PHYSICAL EXAMINATION:  VITAL SIGNS:  The blood pressure was 142/85, pulse  rate 104, respiratory rate 24, temperature 100.5 degrees, and O2 saturation  of 90% on room air.  HEENT:  The patient had extremely dry oral mucosa with bibasilar crackles in  both lung fields.  No wheezes.  HEART:  Essentially normal.  ABDOMEN:  Soft with normoactive bowel sounds and nontender.  EXTREMITIES:  No edema or cyanosis.   LABORATORY DATA:  On admission, hemoglobin of 13.6, hematocrit of 40.8, WBC  of 18.8, and platelet count of 266 with 92% neutrophils and 2% lymphocytes.  Sodium 135, potassium 4.1, chloride  107, CO2 25, BUN 8, creatinine 0.9,  glucose 118.  EKG with normal sinus rhythm.  Cardiac enzymes showed CK-MB of  less than 1, troponin of less than 0.05, and myoglobin of 101.  Chest x-ray  showed bibasilar atelectasis increasing in the left base.  CT of the chest  with contrast showed no evidence of pulmonary embolism.  Consolidation in  the posteromedial left lower lobe with air bronchograms most consistent with  pneumonia.   COURSE IN THE HOSPITAL:  #1 - PNEUMONIA, COMMUNITY ACQUIRED:  The patient  presented with pleuritic chest pain aggravated with coughing and deep  inspiration associated with fever, increased WBC count, and chest x-ray and  CT scan of the chest suggestive of pneumonia.  The patient was started on  Rocephin and azithromycin IV.  The patient became afebrile  with decrease in  the pleuritic chest pain.  The patient will continue on Avelox 400 mg p.o.  to complete a total of 14-day course of antibiotics.   #2 - RULE OUT MYOCARDIAL INFARCTION:  The patient came in with chest pain.  Serial EKGs and cardiac enzymes showed negative results.   #3 - RULE OUT PULMONARY EMBOLISM:  The patient had a CT of the chest which  did not show any pulmonary embolism.  His tachycardia and hypoxia may be  explained by the pneumonic process.   #4 - FLUIDS, ELECTROLYTES, AND NUTRITION:  The patient tolerated a regular  diet.  He did not have any further episodes of nausea or vomiting.   CONDITION ON DISCHARGE:  The patient was discharged with marked improvement  in the pleuritic chest pain which was relieved with NSAIDs.  The patient was  tolerating diet and was ambulating in the room.  Vital signs were stable.  The patient had been afebrile for the past two days.  On lung examination,  there were fine inspiratory crackles on the mid base lung fields with good  air entry and no wheezes.  O2 saturations were 95-98% on room air.  The  patient was advised to continue Avelox 400 mg p.o. for  the next 12 days and  to follow up with his primary care physician in one to two weeks.      Nicholas Sabal, MD                         Nicholas [name], M.D.    MC/MEDQ  D:  08/17/2003  T:  08/19/2003  Job:  [id number]   cc:   Dr. Jocelyn Lamer [facility]

## 2010-10-24 NOTE — Discharge Summary (Signed)
NAME:  Nicholas [name], Nicholas [name]               ACCOUNT NO.:  [id number]   MEDICAL RECORD NO.:  [id number]          PATIENT TYPE:  INP   LOCATION:  2025                         FACILITY:  [facility]   PHYSICIAN:  Olga Millers, M.D. LHCDATE OF BIRTH:  Nov 23, 1940   DATE OF ADMISSION:  09/19/2004  DATE OF DISCHARGE:  09/23/2004                                 DISCHARGE SUMMARY   PROCEDURES:  1.  Cardiac catheterization.  2.  Coronary arteriogram.  3.  Left ventriculogram.  4.  PTCA and HORIZON study stent to the right coronary artery reducing the      stenosis from 100% to 0.   DISCHARGE DIAGNOSES:  1.  Acute inferior myocardial infarction with total distal right coronary      artery and an ejection fraction of 45-50% with inferior      akinesis/hypokinesis status post HORIZON stent reducing the stenosis to      0.  2.  Residual left anterior descending disease of 50-60% proximal and distal      as well as an obtuse marginal at 50%, outpatient Cardiolite in      approximately six weeks for evaluation per M.D.  3.  Possible peripheral vascular disease.  Outpatient abdominal ultrasound      and carotid Dopplers in six weeks.  4.  Hyperlipidemia.  Total cholesterol 225, triglycerides 46, HDL of 45, LDL      171.  Lipitor 80 started this admission.  Follow-up cholesterol and      liver profile in six weeks.  5.  Remote history of tobacco use, quit 14 years ago.  6.  Family history of coronary artery disease with his father dying of an      myocardial infarction at age 35.  7.  History of prostate cancer.  8.  Constipation and hemorrhoids.  Treated with daily Metamucil.   HOSPITAL COURSE:  Mr. Dattilo is a 70 year old male with no known history of  coronary artery disease.  He quit smoking 14 years ago, but occasionally  chews tobacco and has cardiac risk factors of hypertension, hyperlipidemia,  and family history.  He came to the hospital via EMS when he had onset of  chest pain.  His EKG indicated  an acute inferior MI and he was taken  urgently to the catheterization laboratory.   The cardiac catheterization showed multiple noncritical lesions in the LAD  between 30-60%.  He also had nonobstructive disease in the circumflex and  OM.  The RCA was large and ectatic and totaled in the distal portion.  His  EF was 45-50% with inferior akinesis and hypokinesis.  He had percutaneous  intervention to the RCA with HORIZON stent reducing the stenosis from 100%  to 0.  He tolerated the procedure well.   Post procedure he had some hypotension with a systolic blood pressure in the  70s that decreased when he was standing to 65.  He was hydrated and his  blood pressure medications were held overnight.   The next day he had been started on beta blocker which was changed to Toprol  XL 50 mg daily.  He also had an ACE inhibitor added to his medication  regimen at enalapril 2.5 mg daily.  He was tolerating these well and  ambulating without chest pain or shortness of breath.  He was considered  stable for discharge on September 23, 2004 with outpatient follow-up arranged.   DISCHARGE INSTRUCTIONS:  His activity level to include no strenuous activity  until cleared by M.D.  He can drive again in five days.  He is to call the  office for any problems with catheterization site.  He is to stick to a low  fat diet.  He is to follow up with Dr. [name] on May 3 at 2:15 p.m. and he  is to get fasting lipid profile and liver function testing as well as an  outpatient abdominal ultrasound and carotid Dopplers and Cardiolite to  assess the LAD disease in six weeks.   DISCHARGE MEDICATIONS:  1.  Aspirin 325 mg daily.  2.  Plavix 75 mg daily.  3.  Lipitor 80 mg q.h.s.  4.  Nitroglycerin sublingual p.r.n.  5.  Toprol XL 50 mg daily.  6.  Enalapril 2.5 mg daily.  7.  Metamucil daily.      RB/MEDQ  D:  09/23/2004  T:  09/23/2004  Job:  [id number]   cc:   Rollene Rotunda, M.D.   [name], M.D.  [address] [place]  [place] [address]  Fax: [phone]

## 2010-10-24 NOTE — Discharge Summary (Signed)
NAME:  Nicholas, [name] NO.:  [id number]   MEDICAL RECORD NO.:  [id number]          PATIENT TYPE:  INP   LOCATION:  2031                         FACILITY:  [facility]   PHYSICIAN:  [place] Bing, M.D. [facility] OF BIRTH:  09-12-1940   DATE OF ADMISSION:  10/31/2005  DATE OF DISCHARGE:  11/02/2005                                 DISCHARGE SUMMARY   PROCEDURE:  1.  Cardiac catheterization.  2.  Coronary arteriogram.  3.  Left ventriculogram.  4.  PTCA with a cutting balloon for in-stent restenosis.   PRIMARY DIAGNOSIS:  Inferior myocardial infarction.   SECONDARY DIAGNOSES:  1.  History of myocardial infarction in April 2006 with Horizon stent to the      right coronary artery.  2.  Hypertension.  3.  Hyperlipidemia.  4.  Ongoing tobacco use with chewing tobacco.  5.  History of prostate cancer with resection in April 2005.  6.  Family history of coronary artery disease in his father who died at age      56.  7.  History of constipation and hemorrhoids.   TIME AT DISCHARGE:  37 minutes.   HOSPITAL COURSE:  [name] is a 70 year old male with a history of  coronary artery disease.  He had onset of chest pain about noon on the day  of admission which resolved but came back unrelentingly at 1 p.m.  He came  to the emergency room where his EKG indicated an acute inferior myocardial  infarction and he was taken urgently to the cath lab.   [name] had in-stent restenosis which was treated with PTCA and a cutting  balloon, reducing the stenosis from 95% to less than 10%.  His cardiac  enzymes peaked at CK of 683 with an MB of 95.  Once the cutting balloon  angioplasty was performed, he had no further chest pain.   [name] did well post procedure and was transferred to the telemetry the  next day.  He is referred to cardiac rehab as an outpatient but, because of  the holiday, they did not see him in house.   [name] had a lipid profile performed which  showed a total cholesterol of  172, triglycerides 57, HDL 59, LDL 122.  He had problems with a rash, mainly  on his hands, that was a blistering type of rash with associated pruritus.  He had seen his family physician and been given a steroid cream which  improved his symptoms.  For a while, he was off Plavix and statin, but after  he started the statin back, the rash began recurring.  He also had noted  recurrence of the rash in the hospital.   On Oct 31, 2005, [name] was ambulating without chest pain or shortness  of breath and considered stable for discharge.  He is to continue taking the  Plavix and hold off on the statin for a week while he treats the rash with  the cream given to him by his family physician. If the rash continues, he is  to call us.  If the  rash resolves but then recurs once he is started back on  a different statin, we may have to consider other avenues for treating his  elevated LDL.   DISCHARGE INSTRUCTIONS:  His activity level is to be reduced for the next  three days and he is on light duty for two weeks.  He is to call our office  for problems with the cath site.  He is to stick to a low-fat and salt diet.  He is to follow up with research and the PA on June 15, at 3:15 p.m.  He is  to follow up with Dr. Christell Constant at [facility] as needed.   DISCHARGE MEDICATIONS:  1.  Plavix 75 mg daily.  2.  Coated aspirin 325 mg a day.  3.  Toprol XL 50 mg a day.  4.  Nitroglycerin sublingual p.r.n.  5.  Zocor 40 mg daily starting in one week.  6.  No Lipitor.      Theodore Demark, P.A. [facility]      [place] Bing, M.D. [facility]  Electronically Signed    RB/MEDQ  D:  11/02/2005  T:  11/02/2005  Job:  [id number]   cc:   Dr. Jocelyn Lamer [facility]

## 2010-10-24 NOTE — Cardiovascular Report (Signed)
NAME:  DAWID, DUPRIEST NO.:  [id number]   MEDICAL RECORD NO.:  [id number]          PATIENT TYPE:  INP   LOCATION:  2931                         FACILITY:  [facility]   PHYSICIAN:  Charlies Constable, M.D. [facility] DATE OF BIRTH:  1940/09/27   DATE OF PROCEDURE:  09/19/2004  DATE OF DISCHARGE:                              CARDIAC CATHETERIZATION   HISTORY:  Mr. Nieto is 70 years old and presented to our emergency room  with chest pain and EKG changes and acute diaphragmatic wall infarction.  He  was brought urgent to the cath lab and underwent diagnostic catheterization  by Dr. Nicholes Mango who found a total occlusion of the right coronary  artery and moderate disease in the LAD.  His inferior wall was akinetic.  We  made decision to proceed with intervention on the right coronary artery.   PROCEDURE:  The patient was enrolled in the Horizons trial and was  randomized to unfractionated heparin and Integrilin.  He was given double  bolus Integrilin and weight adjusted heparin to prolong the A3 greater than  7 seconds.  He also received 300 mg of Plavix.   We used a 6 [place] guiding catheter with side holes and a Prowater wire.  We crossed the total occluded distal right coronary artery with the wire  without too much difficulty.  The wire did not reestablish flow.  We then  used a 3.0 x 15 mm Maverick and performed two inflations up to 8 atmospheres  by 30 seconds.  This reestablished flow.  There was a filling defect in the  distal LAD, but we elected not to treat this with any type of thrombectomy  device.  We then stented the lesion with a 3.5 x 32 millimeter study stent  deploying this with one inflation of 15 atmospheres by 30 seconds.  This  resulted in distal embolization to two small to moderate-sized  posterolateral branches.  TIMI3 flow was established with the balloon  inflations and remained TIMI3 after the stent implantation in the other  vessels.  We then  performed an IVUS measurement with the Atlantis IVUS  Catheter performing automatic pullback which showed good expansion and  apposition of the stent.  Final diagnostic studies were then performed  through the guiding catheter.  We elected not to pursue the distal emboli  because they were distal and the posterolateral branches did not appear  large.  The patient tolerated the procedure well and left the laboratory in  satisfactory condition.   RESULTS:  Initially the right coronary artery was a large ectatic vessel  that was completed occluded in its distal portion before the posterior  descending branch.  Following stenting, the stenosis improved 100% to 0% and  the flow improved from TIMI0 to TIMI3 flow.  Distal vessel consists of  posterior descending and four posterolateral branches.  Two of the  posterolateral branches had cutoff due to distal embolization, but these  appeared to be small vessels.   CONCLUSION:  1.  Acute diaphragmatic wall infarction with total occlusion of the right      coronary  artery and noncritical disease in the left anterior descending      and circumflex artery.  2.  Successful stenting of the lesion in the distal right coronary artery      using a Horizon study stent with      improvement in __________narrowing from 100% to 0% improvement in flow      from TIMI0 to TIMI3 flow, but with distal embolization and two small      posterolateral branches.   DISPOSITION:  The patient returned __________further observation.      BB/MEDQ  D:  09/19/2004  T:  09/19/2004  Job:  [id number]   cc:   Arvilla Meres, M.D. [facility]   Olga Millers, M.D. [facility]

## 2010-10-24 NOTE — Cardiovascular Report (Signed)
NAME:  GRANTHAM, HIPPERT NO.:  [id number]   MEDICAL RECORD NO.:  [id number]          PATIENT TYPE:  INP   LOCATION:  2031                         FACILITY:  [facility]   PHYSICIAN:  Arvilla Meres, M.D. [facility] OF BIRTH:  Sep 17, 1940   DATE OF PROCEDURE:  10/31/2005  DATE OF DISCHARGE:                              CARDIAC CATHETERIZATION   PRIMARY CARE PHYSICIAN:  Dr. Vernon Prey.   CARDIOLOGIST:  Dr. Olga Millers.   PATIENT IDENTIFICATION:  Nicholas [name] is a 70 year old male with known  coronary artery disease.  He suffered an acute inferior wall myocardial  infarction in April of 2006 and underwent acute intervention by Dr. Charlies Constable with the Horizon study stent.  He has been doing very well.  Approximately one month ago, he stopped his Plavix.  It sounds like he  thought he had a rash but there was also a question whether this might have  been due to his Lipitor.  This morning, he was working when he developed a  twinge in his chest about noon.  This resolved and about 1 o'clock he had  onset of severe substernal chest pain with nausea and vomiting and  diaphoresis.  He is brought to the emergency room.  EKG showed mild inferior  ST elevation.  He is brought emergently to the cardiac catheterization lab.   PROCEDURES PERFORMED:  1.  Selective coronary angiography.  2.  Left heart cath.  3.  Left ventriculogram.   DESCRIPTION OF PROCEDURE:  The risks and benefits of catheterization were  explained.  Consent was signed and placed on the chart.  A 6-French arterial  sheath was placed in the right femoral artery using a modified Seldinger  technique.  Standard catheters including preformed Judkins JL-4, JR-4 and  angled pigtail were used.  There were no apparent complications.   Central aortic pressure is 130/82 with a mean of 104.  LV pressure is 151/13  with an EDP of 24.  There is a question of a mild gradient across the aortic  valve of about 8-10 mmHg.   However, the catheter crossed easily without any  difficulty.   Left main angiographically normal.   LAD was a long vessel coursing to the apex.  It gave off three diagonals:  The first two are small, the third one was moderate-sized.  The LAD was  diffusely diseased.  There was a 50-60% focal proximal stenosis followed by  a tandem 50% lesion.  In the mid-vessel, there is a diffuse 40% disease and  there was mild disease distally.   Left circumflex gave off small OM-1 and a large branching OM-2.  There is a  30% ostial stenosis and 50% tubular lesion in the proximal portion of the OM-  2.   Right coronary artery was a large dominant vessel.  It was ectatic.  There  was diffuse mild disease throughout of about 30%.  There is evidence of a  distal stent.  The vessel was totally occluded with thrombus at the origin  of the distal stent.   Left ventriculogram in the RAO projection showed  an EF of 50% with inferior  basilar akinesis.  There was no mitral regurgitation.   ASSESSMENT:  1.  Three-vessel coronary artery disease as described above.  2.  Acute stent thrombosis of the distal right coronary artery after      discontinuing Plavix one month ago.  3.  Low normal ejection fraction with inferior basilar akinesis.   The plan will be for percutaneous intervention on the distal right coronary  artery by Dr. Charlies Constable.      Arvilla Meres, M.D. [facility]  Electronically Signed     DB/MEDQ  D:  10/31/2005  T:  11/02/2005  Job:  [id number]   cc:   [name], M.D.  Fax: [phone]   Olga Millers, M.D. [facility]  [address]  [address]  [place]  [place] [address]

## 2010-10-24 NOTE — Op Note (Signed)
NAME:  Nicholas [name], Nicholas [name]                         ACCOUNT NO.:  [id number]   MEDICAL RECORD NO.:  [id number]                   PATIENT TYPE:  INP   LOCATION:  0005                                 FACILITY:  [facility]   PHYSICIAN:  Excell Seltzer. Annabell Howells, M.D.                 DATE OF BIRTH:  [birth year]   DATE OF PROCEDURE:  09/20/2003  DATE OF DISCHARGE:                                 OPERATIVE REPORT   PREOPERATIVE DIAGNOSES:  Prostate cancer.   POSTOPERATIVE DIAGNOSES:  Prostate cancer.   PROCEDURE:  Nerve sparing radical retropubic prostatectomy with pelvic  lymphadenectomy.   SURGEON:  Excell Seltzer. Annabell Howells, M.D.   ASSISTANT:  Thyra Breed, MD   ANESTHESIA:  General.   DRAINS:  22 French Foley catheter to straight drain and a 10 [place] Blake  drain to bulb suction.   ESTIMATED BLOOD LOSS:  200 mL.   COMPLICATIONS:  None.   INDICATIONS FOR PROCEDURE:  Nicholas [name] is a 70 year old male who underwent  prostate biopsy for an elevated PSA value of 4.9.  The left prostatic needle  biopsy showed a focus of Gleason 3+3=6 prostatic adenocarcinoma consistent  with a stage T1C tumor.  Several therapies were discussed with the patient  including watchful waiting, surgical intervention as well as external beam  and seed implantation.  Of note, the patient does have significant lower  urinary tract symptoms and has been in urinary retention in the past from  use of decongestants.  The patient has elected to proceed with radical  retropubic prostatectomy.  He understands the risks, benefits, and  alternatives of the procedure and is willing to proceed.   DESCRIPTION OF PROCEDURE:  Following identification by his arm bracelet, the  patient was brought to the operating room and placed in the supine position.  A small bump was placed in the patient's lumbar region. The patient then  underwent general endotracheal anesthesia and was given preoperative IV  antibiotics.  His lower abdomen was then shaved. He was  prepped with  Betadine solution and draped in the usual sterile fashion. A 20 French Foley  catheter was then inserted into the bladder and the bladder drained.   A lower midline incision was then made with the scalpel from the pubis to  just below the umbilicus.  The knife was then used to carry down the  incision through the subcutaneous tissues as well as the anterior rectus  fascia. Any subcutaneous bleeding was controlled with the Bovie  electrocautery.  The rectus muscle was then identified and parted in the  midline. The transversalis fascia was opened and blunt dissection was used  to expose both the right and the left pelvic fossa.  The right iliac fossa  was then exposed with malleable retractors using the Bookwalter self  retaining unit and dissection of the right sided lymph node packet was  initiated over the iliac vein.  The node  packet was rather small and there  was no evidence of gross nodal disease. The lymph node dissection on the  right was then completed with the limits of the dissection including the  external iliac vein, the obturator nerve, the circumflex iliac vein, and the  bifurcation of the iliac artery.  Large Hem-o-lok clips were then used to  control vascular and lymphatic channels. The obturator nerve was in plain  site and protected throughout the dissection.  Again no gross obvious nodal  disease was noted.  A small pack was then placed within the right pelvic  fossa.  We then turned our attention to the left node dissection which was  performed in identical fashion without event again using large clips for  control of lymphatic and vascular channels.  The inferior portion of both  node packets was controlled with a 2-0 silk tie.  Once the node dissections  were complete and a pack was also placed within the left iliac fossa, we  began our dissection of the prostate.   The retractors were then readjusted. The endopelvic fascia was identified  and  punctured on the right.  Blunt dissection was then used to free the  lateral aspect of the prostate.  Any remaining endopelvic fascia was then  released using a right angled dissection and Bovie electrocautery.  This was  then repeated on the left side.  The fascia was then incised superiorly as  it reflected over the prostate.  The fat overlying the dorsal venous complex  was then teased away to narrow the area.  The puboprostatic ligaments were  then gently taken down at their extreme lateral attachments to the pubis.  Those in the midline area were left intact.  The Hohenfelner clamp was then  placed beneath the dorsal venous complex. A #1 Vicryl tie was then passed  and tied around the dorsal venous complex.  An Allis clamp was then used to  grasp the edges of the endopelvic fascia and approximate them over the  prostate. A 2-0 Vicryl suture on the UR-5 needle was then used in a figure-  of-eight fashion to control any potential back bleeding vessels. The dorsal  venous complex was then divided exposing the apex of the prostate and the  urethra.  A Vanderbilt was then used to carefully dissect the neurovascular  bundle off the urethra laterally on each side.  The Metzenbaum scissors were  then used to incise the anterior urethra and expose the Foley catheter  below.  The Hohenfelner clamp was then again passed this time below the  urethra and a moistened umbilical tape passed to hold this orientation.  The  Foley catheter was then lubricated and a right angled clamp used to deliver  the Foley catheter and pull the catheter into the wound.  The catheter was  then used to provide cephalad traction on the prostate. The posterior  urethra was then divided and all rectourethralis attachments were taken down  bluntly.  The prostate was then carefully dissected from the rectum bluntly.  This allowed excellent exposure of the lateral pedicles.  These were then taken down successively using right  angled dissection and large right angled  Hem-o-lok  clips. Once the prostate had been sufficiently elevated, the  anterior leaf of  Denonvillier's fascia was incised over the seminal  vesicles.  This allowed delivery of the ampulla of the vas which were  dissected out with a right angled clamp and then ligated using large Hem-o-  lok  clips. The seminal vesicles were then dissected in a similar fashion  and ligated using large clips.  A portion of the left seminal vesicle was  left rather long during its detachment from the prostate. This seminal  vesicle was further dissected out and taken at a lower level and sent with  the specimen.   We then turned our attention anteriorly where the bladder neck was grasped  between two Allis clamps. Using Bovie electrocautery and a tonsil clamp, the  bladder neck fibers were then dissected out. Once the anterior bladder neck  was divided, the Foley catheter balloon was deflated and the Foley catheter  brought from the bladder and used to provide traction on the prostate.  Both  ureteral orifices were easily identified well away from the bladder neck and  seemed to be effluxing blue urine from an earlier administration of indigo  carmine. The posterior bladder neck was then divided along with any  remaining posterior prostatic attachments.  The bladder neck was then  reconstructed with an initial eversion of the bladder neck mucosa anteriorly  using interrupted 4-0 chromic suture.  The bladder neck was then  reapproximated in a tennis racquet fashion using a running 2-0 chromic  stitch.  Care was again taken to avoid the ureteral orifices which were  easily visualized. The residual bladder neck was large enough to admit the  tip of the fifth finger of the surgeon.  The wound was then copiously  irrigated and any active bleeding was controlled using Bovie electrocautery  and small Hem-o-lok  clips.  At this point, a fresh 22 French Foley catheter  was  inserted.  At this point, the small Greenwald device was passed into the  urethra and anastomotic sutures were then placed with 2-0 Vicryl from  outside to in on the urethral stump at the 2, 5, 7 and 10 o'clock locations.  These sutures were then placed in successive fashion at their corresponding  areas on the bladder neck from an inside to out fashion.  A new Foley  catheter was then placed through the bladder neck and guided into the  bladder. The balloon was inflated with 15 mL of sterile fluid and the 12  o'clock anastomotic suture was placed from the urethral stump to the bladder  neck.   At this point, the wound was again copiously irrigated and there was no  further active bleeding.  The malleable tractor was then removed and the  bladder neck was securely positioned against the urethral stump removing all  slack from the anastomotic sutures.  All anastomotic sutures were then tied and trimmed.  The bladder was then irrigated. The urine was found to be pink  tinged and the anastomosis water tight.  A #10 flat fluted Blake drain was  then placed through a separate stab wound lateral to the left side of the  abdominal incision.  This was positioned over the anastomotic area and the  right and left pelvic fossa.  The fascia was then closed with a running #1  PDS.  The subcutaneous tissues were irrigated with sterile saline and the  skin was closed with surgical clips.  The Foley catheter was irrigated once  again and no bleeding was noted.  The catheter was placed to straight  drainage and the JP drain to bulb suction.  All sponge, needle and  instrument counts were correct x2.  This marked termination of the  procedure.  The patient tolerated the procedure well and there were  no  complications.  Please note that Dr. Annabell Howells was present and participated in  the entire procedure as he was the responsible surgeon.   DISPOSITION:  After awakening from general anesthesia, the patient was   transported to the post anesthesia care unit in stable condition.  From  here, he will be transferred to the floor for further postoperative  management.     Thyra Breed, MD                            Excell Seltzer. Annabell Howells, M.D.    EG/MEDQ  D:  09/20/2003  T:  09/20/2003  Job:  [id number]

## 2010-10-24 NOTE — Assessment & Plan Note (Signed)
[facility]                              CARDIOLOGY OFFICE NOTE   NAME:Nicholas [name], Nicholas [name]                      MRN:          [id number]  DATE:01/27/2006                            DOB:          15-May-1941    PRIMARY:  [facility]   REASON FOR PRESENTATION:  Patient with coronary artery disease.   HISTORY OF PRESENT ILLNESS:  The patient is a pleasant 70 year old gentleman  with a history of coronary artery disease as described.  He last was seen by  the PA in our office and was intolerant of statins.  He started him on  Pravachol 40 mg.  He seems to be tolerating this.  He gets some nighttime  leg cramps but has no resting muscle aches or other symptoms consistent with  a statin-induced myalgia.  He has had no chest discomfort, neck arm  discomfort, arm discomfort.  He has had no palpitations, syncope or  presyncope.  He works 15 hours a day doing hard work.   PAST MEDICAL HISTORY:  1. Coronary artery disease (status post ST-elevation myocardial infarction      in April 2006 with an occluded right coronary artery requiring      stenting.  He was in [employer] study.  He had second myocardial      infarction Oct 31, 2005 with in-stent restenosis and transluminal      angioplasty and cutting balloon.)  2. Hypertension.  3. Hyperlipidemia.  4. Prostate cancer status post resection April 2005.  5. Constipation.  6. Hemorrhoids.   ALLERGIES:  NONE.   MEDICATIONS:  1. Plavix 75 mg daily.  2. Aspirin 81 mg daily.  3. Metoprolol 25 mg daily.  4. Simvastatin 40 mg daily.   REVIEW OF SYSTEMS:  As stated in the HPI and otherwise negative for other  systems.   PHYSICAL EXAMINATION:  The patient is in no distress.  Blood pressure  118/82, heart rate 80 and regular, weight 183 pounds.  HEENT:  Eyes unremarkable.  Pupils equal, round, reactive to light.  Fundi  not visualized.  NECK:  No jugular venous distension.  Wave form  within normal limits.  Carotid upstroke brisk and symmetric, no bruits or thyromegaly.  LYMPHATICS:  No cervical, axillary or inguinal adenopathy.  LUNGS:  Clear to auscultation bilaterally.  BACK:  No costovertebral angle tenderness.  CHEST:  Unremarkable.  HEART:  PMI not displaced or sustained, S1 and S2 within normal limits.  No  S3, no S4, no murmurs.  ABDOMEN:  Flat, positive bowel sounds, normal in frequency and pitch, no  bruits, rebound or guarding.  No midline pulsatile masses, organomegaly.  SKIN:  No rashes, no nodules.  EXTREMITIES:  2+ pulses throughout, no edema, no cyanosis, no clubbing.  NEURO:  Oriented to person, place and time.  Cranial nerves II-XII grossly  intact.  Motor grossly intact.   ASSESSMENT AND PLAN:  1. Coronary artery disease.  The patient is having no new symptoms related      to this.  No further cardiovascular testing is suggested.  Will  continue on the Plavix indefinitely.  Will continue the medications as      listed.  2. Dyslipidemia.  He seems to be tolerating the pravastatin.  I am going      to have him check a lipid profile fasting and will make adjustments as      needed.  3. Hypertension.  Blood pressure is well-controlled.  Continue the      medications as listed.  4. Followup.  I will see him back in 6 months and then hopefully yearly      thereafter.                               Rollene Rotunda, MD, [facility]    JH/MedQ  DD:  01/27/2006  DT:  01/28/2006  Job #:  [id number]   cc:   [facility]

## 2010-10-24 NOTE — Op Note (Signed)
NAME:  Nicholas [name], Nicholas [name]               ACCOUNT NO.:  [id number]   MEDICAL RECORD NO.:  [id number]          PATIENT TYPE:  AMB   LOCATION:  NESC                         FACILITY:  [facility]   PHYSICIAN:  Excell Seltzer. Annabell Howells, M.D.    DATE OF BIRTH:  [birth year]   DATE OF PROCEDURE:  10/06/2005  DATE OF DISCHARGE:                                 OPERATIVE REPORT   PREOPERATIVE DIAGNOSES:  Right ureteral stone.   POSTOPERATIVE DIAGNOSES:  Right ureteral stone.   PROCEDURE:  1.  Cystoscopy.  2.  Right retrograde pyelogram with interpretation.  3.  Right ureteroscopic stone manipulation.  4.  Right ureteral stent placement.   SURGEON:  Excell Seltzer. Annabell Howells, M.D.   ASSISTANT:  Garrison Columbus, M.D.   ANESTHESIA:  General endotracheal.   COMPLICATIONS:  None.   DRAINS:  6 French 26 cm double-J ureteral stent with tether.   DISPOSITION:  Stable to post anesthesia care unit.   ESTIMATED BLOOD LOSS:  Minimal.   INDICATIONS FOR PROCEDURE:  Nicholas [name] is a 70 year old gentleman who has a  history of kidney stones. He has a recent history of radiolucent right  ureteral stone. He has had a recent CT study which revealed a proximal  ureteral stone on the right. He was offered ureteroscopic stone manipulation  and after benefits and risks were discussed, his consent was obtained.   FINDINGS:  Cystoscopy was normal. There was evidence of a slight bladder  neck contracture. Retrograde pyelography revealed filling defect in the  right distal ureter consistent with previous stone. There was no other  filling defect or hydronephrosis noted on the right.   DESCRIPTION OF PROCEDURE:  The patient was brought to the operating room and  properly identified. A timeout was performed to confirm correct patient,  side and procedure. He was administered general anesthesia and placed in the  dorsal lithotomy position and prepped and draped in the usual sterile  fahsion. Cystoscopy was then performed with 30 and 70 degree  lens. He did  have evidence of a tight bladder neck and has had a prostatectomy in the  past. The scope was unable to be easily advanced through this and it was  subsequently dilated using the [employer] sounds to 26 [place]. After this,  the scope passed easily into the bladder. Examination of the bladder  revealed no evidence of a foreign body or stone. The mucosa was free of any  focal abnormalities. Both ureteral orifices were in their normal anatomic  position effluxing clear urine.   We then performed a retrograde pyelogram. There was evidence of a filling  defect in the distal right ureter consistent with the known ureteral stone.  This was just distal to the pelvic brim. The remaining ureter was free of  any filling defects as well as the renal pelvis. We then placed a wire up  the ureter and dilated the distal ureter using the dilator from a ureteral  access sheath. This was removed and a semirigid ureteroscope was used to  access the lower ureter. We identified the stone. An attempt was made to  retrieve the stone using a basket which was unsuccessful. We then proceeded  to dilate the distal ureter using the ureteral balloon dilator. The dilator  was passed along side the ureteroscope to ensure that the dilator was in  proper position. This was confirmed with fluoro. We then dilated the distal  ureter without complication. The stone was still unable to be retrieved with  the basket and we elected to proceed with laser lithotripsy. The stone was  visualized and fragmented using settings of 0.5 and 5. The stone was  fragmented into approximately 3 pieces. These were then retrieved with the  basket without complication. We then removed the ureteroscope, replaced the  cystoscope and evacuated the stone fragments. At this point, a 6 French 26  cm double-J ureteral stone was advanced over the wire into proper position,  confirmed using cystoscopic and fluoroscopic guidance. At the  conclusion of  the procedure, the bladder was emptied and the procedure was terminated.  There were no complications. Please note that Dr. Annabell Howells was present and  participated in all aspects of this procedure as he is the primary Artist.   DISPOSITION:  The patient was taken to the recovery room in stable  condition. He will followup Friday for stent removal.     ______________________________  Garrison Columbus, MD      Excell Seltzer. Annabell Howells, M.D.  Electronically Signed    JH/MEDQ  D:  10/06/2005  T:  10/06/2005  Job:  [id number]

## 2010-10-24 NOTE — Cardiovascular Report (Signed)
NAME:  CAMARA, ROSANDER NO.:  [id number]   MEDICAL RECORD NO.:  [id number]          PATIENT TYPE:  INP   LOCATION:  2031                         FACILITY:  [facility]   PHYSICIAN:  Charlies Constable, M.D. [facility] DATE OF BIRTH:  [birth year]   DATE OF PROCEDURE:  10/31/2005  DATE OF DISCHARGE:                              CARDIAC CATHETERIZATION   CLINICAL HISTORY:  Mr. Mccowen is 70 years old and in April of 2006 had an  acute diaphragmatic wall infarction treated with a Horizon study stent of  the distal right coronary artery as part of the Horizon study.  One month  ago, he stopped his Plavix due to a rash.  Today, he developed chest pain  and came to the emergency room and had 1 mm inferior ST elevation on his ECG  and was seen by Dr. Gala Romney and brought to the laboratory.  Dr. Gala Romney  did the diagnostic study and found a total occlusion at the stent site in  the distal right coronary artery.   PROCEDURE NOTE:  The procedure was performed by the right femoral artery  initially with a JR-4 6-French guiding catheter and then we switched to an  AL-1 7-French guiding catheter with side holes.  We crossed the stent with  some difficulty with Prowater wire.  We were unable to advance a balloon  across the stent.  We passed a second buddy wire across the stent and then  we were able to pass a 1.5 and then a 2.0 balloon into the stent.  We  dilated the 2.0 balloon up to 10 atmospheres for 30 seconds.  After the  initial balloon inflation, we attempted to pass a diver aspiration catheter  but were unable to pass this into the stent.  Following dilatation with a  2.0 balloon, we went in with a 3.5 x 15 mm cutting balloon but were unable  to pass the cutting balloon into the stent.  We then removed one of the  Prowater wire and advanced a wiggle wire across the stent.  With the help of  the wiggle wire, we were able to advance the cutting balloon into the stent  and located it at the  lesion.  We performed a total of six inflations up to  10 atmospheres for 30 seconds.  We then removed the cutting balloon and  passed a 3.75 x 20 mm Quantum Maverick and performed three inflations up to  16 atmospheres for 30 seconds.  Final diagnostic study was then performed  with a guiding catheter.   The right femoral artery was closed with AngioSeal at the end of the  procedure.  The patient tolerated the procedure well and left the laboratory  in satisfactory condition.   RESULTS:  Initially, the right coronary was completely occluded at the  beginning of the stent in the distal right coronary artery.  Following  cutting balloon angioplasty and balloon angioplasty, the stenosis improved  from 100% to 10% and the flow improved from TIMI 0 to TIMI III flow.  There  was a small area in the posterior descending branch  of a filling defect  which probably represented a small distal embolus.   CONCLUSION:  Successful percutaneous coronary intervention of the stent  thrombosis in the distal right coronary artery using a cutting balloon and  balloon angioplasty with improvement of stent narrowing from 100% to 10%  improving the flow from TIMI 0 TIMI III flow.   DISPOSITION:  The patient returned to the room for further observation.  The  patient had the onset of chest pain at 1300 and arrived at [facility]  Emergency Room at 1404.  The first balloon inflation was at 1636.  This gave  a door balloon time of 2 hours and 32 minutes and refusion time of 3 hours  and 36 minutes.   We were not sure whether the stent was a drug-eluting stent or bare-metal  stent.  It did appear there was some tissue ingrowth in the midportion of  the stent, although this was focal and the rest the stent appeared fairly  clear.  My guess is that this was a drug-eluting stent in the tissue  ingrowth may have been related to a malposition.  The stent was not post  dilated at initial implant but was a 3/5 stent  that was deployed at 15  atmospheres.           ______________________________  Charlies Constable, M.D. [facility]     BB/MEDQ  D:  10/31/2005  T:  11/02/2005  Job:  [id number]   cc:   Olga Millers, M.D. [facility]  [address]  [address]  [place]  [place] [address]   Cardiopulmonary Lab

## 2010-12-17 ENCOUNTER — Other Ambulatory Visit: Payer: Self-pay | Admitting: *Deleted

## 2010-12-17 MED ORDER — ROSUVASTATIN CALCIUM 20 MG PO TABS: 20.0000 mg | ORAL_TABLET | Freq: Every day | ORAL | Status: DC

## 2010-12-26 ENCOUNTER — Other Ambulatory Visit: Payer: Self-pay | Admitting: *Deleted

## 2010-12-26 MED ORDER — METOPROLOL TARTRATE 50 MG PO TABS: 50.0000 mg | ORAL_TABLET | ORAL | Status: DC

## 2011-01-13 ENCOUNTER — Encounter: Payer: Self-pay | Admitting: Cardiology

## 2011-03-03 LAB — CBC
HCT: 38.3 — ABNORMAL LOW
HCT: 40.5
HCT: 41.8
Hemoglobin: 13
MCHC: 33.8
MCHC: 34.2
MCV: 88.6
MCV: 89.8
Platelets: 209
Platelets: 211
Platelets: 261
RBC: 4.33
RBC: 4.42
RBC: 4.66
RDW: 13.2
RDW: 13.4
WBC: 12.9 — ABNORMAL HIGH
WBC: 8.7

## 2011-03-03 LAB — BASIC METABOLIC PANEL
BUN: 25 — ABNORMAL HIGH
BUN: 32 — ABNORMAL HIGH
BUN: 7
BUN: 7
CO2: 24
CO2: 25
CO2: 26
CO2: 29
Calcium: 9.1
Calcium: 9.6
Chloride: 108
Chloride: 98
Chloride: 98
Creatinine, Ser: 0.74
Creatinine, Ser: 0.76
Creatinine, Ser: 0.92
Creatinine, Ser: 1.02
Creatinine, Ser: 1.23
GFR calc Af Amer: >60
GFR calc Af Amer: >60
GFR calc non Af Amer: >60
GFR calc non Af Amer: >60
Glucose, Bld: 110 — ABNORMAL HIGH
Glucose, Bld: 117 — ABNORMAL HIGH
Glucose, Bld: 118 — ABNORMAL HIGH
Glucose, Bld: 125 — ABNORMAL HIGH
Glucose, Bld: 98
Potassium: 4
Sodium: 138
Sodium: 139

## 2011-03-03 LAB — CARDIAC PANEL(CRET KIN+CKTOT+MB+TROPI)
Relative Index: INVALID
Troponin I: 0.09 — ABNORMAL HIGH
Troponin I: 13.05

## 2011-03-03 LAB — LIPID PANEL
Cholesterol: 205 — ABNORMAL HIGH
HDL: 32 — ABNORMAL LOW
LDL Cholesterol: 144 — ABNORMAL HIGH
Total CHOL/HDL Ratio: 6.4
Triglycerides: 145

## 2011-03-03 LAB — CK TOTAL AND CKMB (NOT AT ARMC)
CK, MB: 10.5 — ABNORMAL HIGH
Relative Index: 16.5 — ABNORMAL HIGH
Total CK: 107

## 2011-03-03 LAB — APTT: aPTT: 32

## 2011-03-03 LAB — B-NATRIURETIC PEPTIDE (CONVERTED LAB): Pro B Natriuretic peptide (BNP): <30

## 2011-03-03 LAB — PROTIME-INR: Prothrombin Time: 13.2

## 2011-03-18 ENCOUNTER — Encounter: Payer: Self-pay | Admitting: Cardiology

## 2011-03-18 ENCOUNTER — Ambulatory Visit (INDEPENDENT_AMBULATORY_CARE_PROVIDER_SITE_OTHER): Payer: Medicare Other | Admitting: Cardiology

## 2011-03-18 DIAGNOSIS — I2510 Atherosclerotic heart disease of native coronary artery without angina pectoris: Secondary | ICD-10-CM

## 2011-03-18 DIAGNOSIS — I10 Essential (primary) hypertension: Secondary | ICD-10-CM

## 2011-03-18 DIAGNOSIS — E785 Hyperlipidemia, unspecified: Secondary | ICD-10-CM

## 2011-03-18 NOTE — Assessment & Plan Note (Signed)
The blood pressure is at target. No change in medications is indicated. We will continue with therapeutic lifestyle changes (TLC).  

## 2011-03-18 NOTE — Patient Instructions (Signed)
The current medical regimen is effective;  continue present plan and medications.  Follow up in 1 year with Dr Hochrein.  You will receive a letter in the mail 2 months before you are due.  Please call us when you receive this letter to schedule your follow up appointment.  

## 2011-03-18 NOTE — Assessment & Plan Note (Signed)
He is due to have lipids drawn soon.  I would like to review these with a goal LDL less than 100 and HDL greater than 40.

## 2011-03-18 NOTE — Progress Notes (Signed)
HPI The patient presents for follow up of CAD.  Since I last saw him he has done well.  The patient denies any new symptoms such as chest discomfort, neck or arm discomfort. There has been no new shortness of breath, PND or orthopnea. There have been no reported palpitations, presyncope or syncope.  He works hard and even cuts wood without causing symptoms.  No Known Allergies  Current Outpatient Prescriptions  Medication Sig Dispense Refill  . acetaminophen-codeine (TYLENOL #3) 300-30 MG per tablet Take 1 tablet by mouth every 4 (four) hours as needed.        Marland Kitchen aspirin 81 MG tablet Take 81 mg by mouth daily.        . cephALEXin (KEFLEX) 500 MG capsule Take 500 mg by mouth 4 (four) times daily.        . Cholecalciferol (VITAMIN D3) 1000 UNITS CAPS Take 1 capsule by mouth daily.        . clopidogrel (PLAVIX) 75 MG tablet Take 75 mg by mouth daily.        . fish oil-omega-3 fatty acids 1000 MG capsule Take 1 g by mouth daily.        Marland Kitchen loratadine (ALLERGY RELIEF) 10 MG tablet Take 10 mg by mouth daily.        . metoprolol (LOPRESSOR) 50 MG tablet Take 1 tablet (50 mg total) by mouth as directed. 1/2 po bid  60 tablet  6  . rosuvastatin (CRESTOR) 20 MG tablet Take 1 tablet (20 mg total) by mouth daily.  30 tablet  6    Past Medical History  Diagnosis Date  . CAD (coronary artery disease)     2009 inferior myocardial infarction. Previous stenting of his right coronary artery. Subsequently, he had stent thrombosis in the right coronary stent and failed to keep that vessel open even after restenting. He also had LAD 50-60% stenosis, mid to proximal, and OM1 40% stenosis. Well-preserved ejection fraction  . Hypertension   . Hyperlipidemia   . Prostate cancer     s/p resection in april 2005  . Constipation   . Hemorrhoids     Past Surgical History  Procedure Date  . Prostatectomy 2007    ROS:  As stated in the HPI and negative for all other systems.  PHYSICAL EXAM BP 132/78  Pulse 69   Resp 18  Ht 5\' 9"  (1.753 m)  Wt 194 lb (87.998 kg)  BMI 28.65 kg/m2 GENERAL:  Well appearing HEENT:  Pupils equal round and reactive, fundi not visualized, oral mucosa unremarkable NECK:  No jugular venous distention, waveform within normal limits, carotid upstroke brisk and symmetric, no bruits, no thyromegaly LYMPHATICS:  No cervical, inguinal adenopathy LUNGS:  Clear to auscultation bilaterally BACK:  No CVA tenderness CHEST:  Unremarkable HEART:  PMI not displaced or sustained,S1 and S2 within normal limits, no S3, no S4, no clicks, no rubs, no murmurs ABD:  Flat, positive bowel sounds normal in frequency in pitch, no bruits, no rebound, no guarding, no midline pulsatile mass, no hepatomegaly, no splenomegaly EXT:  2 plus pulses throughout, no edema, no cyanosis no clubbing SKIN:  No rashes no nodules NEURO:  Cranial nerves II through XII grossly intact, motor grossly intact throughout PSYCH:  Cognitively intact, oriented to person place and time   EKG:  Normal sinus rhythm, rate 69, left axis deviation, old inferior infarct, no acute ST-T wave changes.  ASSESSMENT AND PLAN

## 2011-03-18 NOTE — Assessment & Plan Note (Addendum)
The patient has no new sypmtoms.  No further cardiovascular testing is indicated.  We will continue with aggressive risk reduction and meds as listed.  He can stop his Plavix for a planned dental procedure.

## 2011-03-23 ENCOUNTER — Telehealth: Payer: Self-pay | Admitting: Cardiology

## 2011-03-23 NOTE — Telephone Encounter (Signed)
Dental office needs to know how long pt needs to be off plavix before tooth can be pulled was told a fax was sent last week but they never got it please fax to [phone]

## 2011-03-23 NOTE — Telephone Encounter (Signed)
Spoke with cindy, pt will hold plavix 4-5 days. Office visit faxed to number provided  Deliah Goody

## 2011-05-06 ENCOUNTER — Encounter: Payer: Self-pay | Admitting: Cardiology

## 2011-07-06 ENCOUNTER — Telehealth: Payer: Self-pay | Admitting: Cardiology

## 2011-07-06 NOTE — Telephone Encounter (Signed)
Dr. Willis Modena office sent request to stop plavix for a procedure this Friday and they have not heard anything and was needing

## 2011-07-06 NOTE — Telephone Encounter (Signed)
Nicholas [name] from Dr. [facility]ul[facility]  GI office  is calling to f/u a request send last Friday on pt  to stop Plavix 5 days prior a Colonoscopy. Procedure is schedule for this Friday February 1 st. 2013. She would like to have a respond by tomorrow . Nicholas [name] is aware that Dr. [name] will be in tomorrow afternoon.

## 2011-07-08 ENCOUNTER — Telehealth: Payer: Self-pay | Admitting: Cardiology

## 2011-07-08 NOTE — Telephone Encounter (Signed)
Follow-up:     Follow up to prior call about a request to hold the patients clopidogrel (PLAVIX) 75 MG tablet for his procedure on Friday. Please call back.

## 2011-07-08 NOTE — Telephone Encounter (Signed)
Left message for Joni Reining of [facility] to hold Plavix for procedure.

## 2011-07-08 NOTE — Telephone Encounter (Signed)
[facility] to hold Plavix as needed for the planned procedure 

## 2011-07-08 NOTE — Telephone Encounter (Signed)
Please see other phone note

## 2011-07-20 ENCOUNTER — Other Ambulatory Visit: Payer: Self-pay | Admitting: *Deleted

## 2011-07-20 MED ORDER — ROSUVASTATIN CALCIUM 20 MG PO TABS: 20.0000 mg | ORAL_TABLET | Freq: Every day | ORAL | Status: DC

## 2011-08-03 ENCOUNTER — Other Ambulatory Visit: Payer: Self-pay | Admitting: *Deleted

## 2011-08-03 MED ORDER — CLOPIDOGREL BISULFATE 75 MG PO TABS: 75.0000 mg | ORAL_TABLET | Freq: Every day | ORAL | Status: DC

## 2012-01-11 ENCOUNTER — Other Ambulatory Visit: Payer: Self-pay | Admitting: Family Medicine

## 2012-01-11 DIAGNOSIS — R609 Edema, unspecified: Secondary | ICD-10-CM

## 2012-01-11 DIAGNOSIS — T148XXA Other injury of unspecified body region, initial encounter: Secondary | ICD-10-CM

## 2012-01-12 ENCOUNTER — Ambulatory Visit
Admission: RE | Admit: 2012-01-12 | Discharge: 2012-01-12 | Disposition: A | Discharge status: Home / Self Care | Payer: Medicare Other | Source: Ambulatory Visit | Attending: Family Medicine | Admitting: Family Medicine

## 2012-01-12 ENCOUNTER — Other Ambulatory Visit: Payer: Medicare Other

## 2012-01-12 DIAGNOSIS — T148XXA Other injury of unspecified body region, initial encounter: Secondary | ICD-10-CM

## 2012-01-12 DIAGNOSIS — R609 Edema, unspecified: Secondary | ICD-10-CM

## 2012-02-24 ENCOUNTER — Other Ambulatory Visit: Payer: Self-pay

## 2012-02-24 MED ORDER — ROSUVASTATIN CALCIUM 20 MG PO TABS: 20.0000 mg | ORAL_TABLET | Freq: Every day | ORAL | Status: DC

## 2012-02-24 MED ORDER — CLOPIDOGREL BISULFATE 75 MG PO TABS: 75.0000 mg | ORAL_TABLET | Freq: Every day | ORAL | Status: DC

## 2012-02-24 NOTE — Telephone Encounter (Signed)
..   Requested Prescriptions   Pending Prescriptions Disp Refills  . clopidogrel (PLAVIX) 75 MG tablet 30 tablet 6    Sig: Take 1 tablet (75 mg total) by mouth daily.  . rosuvastatin (CRESTOR) 20 MG tablet 30 tablet 6    Sig: Take 1 tablet (20 mg total) by mouth daily.  Marland Kitchen.Patient needs to contact office to schedule  Appointment  for future refills.Ph:[phone]. Thank you.

## 2012-03-29 ENCOUNTER — Other Ambulatory Visit: Payer: Self-pay | Admitting: *Deleted

## 2012-03-29 MED ORDER — METOPROLOL TARTRATE 50 MG PO TABS: 50.0000 mg | ORAL_TABLET | ORAL | Status: DC

## 2012-06-28 ENCOUNTER — Encounter: Payer: Self-pay | Admitting: Cardiology

## 2012-11-01 ENCOUNTER — Other Ambulatory Visit: Payer: Self-pay

## 2012-11-01 MED ORDER — CLOPIDOGREL BISULFATE 75 MG PO TABS: 75.0000 mg | ORAL_TABLET | Freq: Every day | ORAL | Status: DC

## 2012-11-01 NOTE — Telephone Encounter (Signed)
..   Requested Prescriptions   Signed Prescriptions Disp Refills  . clopidogrel (PLAVIX) 75 MG tablet 30 tablet 0    Sig: Take 1 tablet (75 mg total) by mouth daily.    Authorizing Provider: Rollene Rotunda    Ordering User: Christella Hartigan, Romayne Ticas Judie Petit

## 2012-11-29 ENCOUNTER — Other Ambulatory Visit: Payer: Self-pay

## 2012-11-29 MED ORDER — CLOPIDOGREL BISULFATE 75 MG PO TABS: 75.0000 mg | ORAL_TABLET | Freq: Every day | ORAL | Status: DC

## 2012-12-27 ENCOUNTER — Other Ambulatory Visit: Payer: Self-pay

## 2012-12-27 MED ORDER — CLOPIDOGREL BISULFATE 75 MG PO TABS: 75.0000 mg | ORAL_TABLET | Freq: Every day | ORAL | Status: AC

## 2013-05-30 ENCOUNTER — Other Ambulatory Visit: Payer: Self-pay | Admitting: *Deleted

## 2013-05-30 MED ORDER — METOPROLOL TARTRATE 50 MG PO TABS: 50.0000 mg | ORAL_TABLET | ORAL | Status: DC

## 2014-01-03 ENCOUNTER — Ambulatory Visit (HOSPITAL_COMMUNITY): Payer: Medicare HMO | Attending: Cardiology | Admitting: Radiology

## 2014-01-03 ENCOUNTER — Other Ambulatory Visit (HOSPITAL_COMMUNITY): Payer: Self-pay | Admitting: Family Medicine

## 2014-01-03 DIAGNOSIS — I517 Cardiomegaly: Secondary | ICD-10-CM

## 2014-01-03 DIAGNOSIS — I77810 Thoracic aortic ectasia: Secondary | ICD-10-CM

## 2014-01-03 DIAGNOSIS — I2510 Atherosclerotic heart disease of native coronary artery without angina pectoris: Secondary | ICD-10-CM | POA: Diagnosis not present

## 2014-01-03 DIAGNOSIS — I10 Essential (primary) hypertension: Secondary | ICD-10-CM | POA: Insufficient documentation

## 2014-01-03 DIAGNOSIS — I252 Old myocardial infarction: Secondary | ICD-10-CM | POA: Diagnosis not present

## 2014-01-03 DIAGNOSIS — E785 Hyperlipidemia, unspecified: Secondary | ICD-10-CM | POA: Insufficient documentation

## 2014-01-03 NOTE — Progress Notes (Signed)
Echocardiogram performed.  

## 2014-03-04 ENCOUNTER — Other Ambulatory Visit (HOSPITAL_COMMUNITY): Payer: Self-pay | Admitting: Cardiovascular Disease

## 2014-03-05 NOTE — Telephone Encounter (Signed)
Ok to refill? Please advise. Thanks, MI 

## 2014-03-19 ENCOUNTER — Other Ambulatory Visit: Payer: Self-pay | Admitting: *Deleted

## 2014-03-19 MED ORDER — NITROGLYCERIN 0.4 MG SL SUBL: 0.4000 mg | SUBLINGUAL_TABLET | SUBLINGUAL | Status: DC | PRN

## 2014-03-19 NOTE — Telephone Encounter (Signed)
ntg refilled  

## 2015-02-15 ENCOUNTER — Encounter (HOSPITAL_COMMUNITY): Payer: Self-pay | Admitting: Emergency Medicine

## 2015-02-15 ENCOUNTER — Emergency Department (HOSPITAL_COMMUNITY)
Admission: EM | Admit: 2015-02-15 | Discharge: 2015-02-15 | Disposition: A | Discharge status: Home / Self Care | Payer: PPO | Attending: Emergency Medicine | Admitting: Emergency Medicine

## 2015-02-15 DIAGNOSIS — R350 Frequency of micturition: Secondary | ICD-10-CM | POA: Insufficient documentation

## 2015-02-15 DIAGNOSIS — Z8546 Personal history of malignant neoplasm of prostate: Secondary | ICD-10-CM | POA: Insufficient documentation

## 2015-02-15 DIAGNOSIS — Z8719 Personal history of other diseases of the digestive system: Secondary | ICD-10-CM | POA: Insufficient documentation

## 2015-02-15 DIAGNOSIS — E785 Hyperlipidemia, unspecified: Secondary | ICD-10-CM | POA: Diagnosis not present

## 2015-02-15 DIAGNOSIS — Z7901 Long term (current) use of anticoagulants: Secondary | ICD-10-CM | POA: Diagnosis not present

## 2015-02-15 DIAGNOSIS — Z792 Long term (current) use of antibiotics: Secondary | ICD-10-CM | POA: Diagnosis not present

## 2015-02-15 DIAGNOSIS — I2510 Atherosclerotic heart disease of native coronary artery without angina pectoris: Secondary | ICD-10-CM | POA: Diagnosis not present

## 2015-02-15 DIAGNOSIS — R319 Hematuria, unspecified: Secondary | ICD-10-CM | POA: Diagnosis present

## 2015-02-15 DIAGNOSIS — I10 Essential (primary) hypertension: Secondary | ICD-10-CM | POA: Insufficient documentation

## 2015-02-15 DIAGNOSIS — Z79899 Other long term (current) drug therapy: Secondary | ICD-10-CM | POA: Diagnosis not present

## 2015-02-15 DIAGNOSIS — Z87891 Personal history of nicotine dependence: Secondary | ICD-10-CM | POA: Insufficient documentation

## 2015-02-15 DIAGNOSIS — Z7982 Long term (current) use of aspirin: Secondary | ICD-10-CM | POA: Diagnosis not present

## 2015-02-15 LAB — CBC
HEMATOCRIT: 42.9 % (ref 39.0–52.0)
HEMOGLOBIN: 14.5 g/dL (ref 13.0–17.0)
MCH: 30.1 pg (ref 26.0–34.0)
MCHC: 33.8 g/dL (ref 30.0–36.0)
MCV: 89.2 fL (ref 78.0–100.0)
Platelets: 234 10*3/uL (ref 150–400)
RBC: 4.81 MIL/uL (ref 4.22–5.81)
RDW: 13.1 % (ref 11.5–15.5)
WBC: 7.6 10*3/uL (ref 4.0–10.5)

## 2015-02-15 LAB — URINALYSIS, ROUTINE W REFLEX MICROSCOPIC
GLUCOSE, UA: NEGATIVE mg/dL
Ketones, ur: 15 mg/dL — AB
Nitrite: NEGATIVE
PROTEIN: 100 mg/dL — AB
SPECIFIC GRAVITY, URINE: 1.03 (ref 1.005–1.030)
Urobilinogen, UA: 0.2 mg/dL (ref 0.0–1.0)
pH: 5 (ref 5.0–8.0)

## 2015-02-15 LAB — COMPREHENSIVE METABOLIC PANEL
ALK PHOS: 62 U/L (ref 38–126)
ALT: 30 U/L (ref 17–63)
ANION GAP: 10 (ref 5–15)
AST: 28 U/L (ref 15–41)
Albumin: 4.2 g/dL (ref 3.5–5.0)
BILIRUBIN TOTAL: 0.9 mg/dL (ref 0.3–1.2)
BUN: 22 mg/dL — ABNORMAL HIGH (ref 6–20)
CO2: 23 mmol/L (ref 22–32)
Calcium: 10.3 mg/dL (ref 8.9–10.3)
Chloride: 104 mmol/L (ref 101–111)
Creatinine, Ser: 1.32 mg/dL — ABNORMAL HIGH (ref 0.61–1.24)
GFR calc non Af Amer: 52 mL/min — ABNORMAL LOW (ref >60)
Glucose, Bld: 100 mg/dL — ABNORMAL HIGH (ref 65–99)
Potassium: 4.3 mmol/L (ref 3.5–5.1)
Sodium: 137 mmol/L (ref 135–145)
TOTAL PROTEIN: 7.1 g/dL (ref 6.5–8.1)

## 2015-02-15 LAB — URINE MICROSCOPIC-ADD ON

## 2015-02-15 LAB — LIPASE, BLOOD: Lipase: 26 U/L (ref 22–51)

## 2015-02-15 NOTE — Discharge Instructions (Signed)
Please follow up with your urologist Dr. Jeffie Pollock. Please have your kidney function rechecked at the appointment. Your creatinine today was 1.37. Your urinalysis did not indicate sign of infection. Your urine has been sent for culture and we will call in antibiotics if there is signs of a urinary tract infection. Please return to the emergency department if you are unable to urinate, have new or worsening abdominal pain, new or worsening symptoms or new concerns. Hematuria Hematuria is blood in your urine. It can be caused by a bladder infection, kidney infection, prostate infection, kidney stone, or cancer of your urinary tract. Infections can usually be treated with medicine, and a kidney stone usually will pass through your urine. If neither of these is the cause of your hematuria, further workup to find out the reason may be needed. It is very important that you tell your health care provider about any blood you see in your urine, even if the blood stops without treatment or happens without causing pain. Blood in your urine that happens and then stops and then happens again can be a symptom of a very serious condition. Also, pain is not a symptom in the initial stages of many urinary cancers. HOME CARE INSTRUCTIONS   Drink lots of fluid, 3-4 quarts a day. If you have been diagnosed with an infection, cranberry juice is especially recommended, in addition to large amounts of water.  Avoid caffeine, tea, and carbonated beverages because they tend to irritate the bladder.  Avoid alcohol because it may irritate the prostate.  Take all medicines as directed by your health care provider.  If you were prescribed an antibiotic medicine, finish it all even if you start to feel better.  If you have been diagnosed with a kidney stone, follow your health care provider's instructions regarding straining your urine to catch the stone.  Empty your bladder often. Avoid holding urine for long periods of  time.  After a bowel movement, women should cleanse front to back. Use each tissue only once.  Empty your bladder before and after sexual intercourse if you are a male. SEEK MEDICAL CARE IF:  You develop back pain.  You have a fever.  You have a feeling of sickness in your stomach (nausea) or vomiting.  Your symptoms are not better in 3 days. Return sooner if you are getting worse. SEEK IMMEDIATE MEDICAL CARE IF:   You develop severe vomiting and are unable to keep the medicine down.  You develop severe back or abdominal pain despite taking your medicines.  You begin passing a large amount of blood or clots in your urine.  You feel extremely weak or faint, or you pass out. MAKE SURE YOU:   Understand these instructions.  Will watch your condition.  Will get help right away if you are not doing well or get worse. Document Released: 05/25/2005 Document Revised: 10/09/2013 Document Reviewed: 01/23/2013 [facility] Patient Information 2015 [place], [place]. This information is not intended to replace advice given to you by your health care provider. Make sure you discuss any questions you have with your health care provider.

## 2015-02-15 NOTE — ED Provider Notes (Signed)
CSN: [id number]     Arrival date & time 02/15/15  1558 History   First MD Initiated Contact with Patient 02/15/15 1832     Chief Complaint  Patient presents with  . Hematuria   Nicholas [name] is a 74 y.o. male with a history of prostate cancer with prostatectomy, CAD on plavix and HTN who presents to the ED complaining of painless gross hematuria since today. The patient reports that he noticed bright red blood in is urine today about 4 times. He reports there does not seem to be as much blood in his urine the more he has urinated today. He denies any dysuria or abdominal pain. He reports frequency of urination today. He denies urinary urgency or decreased urination. He denies trouble urinating. He denies passing blood clots.  He denies rectal pain, fevers, chills, abdominal pain, nausea, vomiting, diarrhea, penile pain, back pain, penile discharge, scrotal swelling, scrotal tenderness, rashes, or difficulty urinating. Patient is followed by urologist Dr. Jeffie Pollock at [facility].  (Consider location/radiation/quality/duration/timing/severity/associated sxs/prior Treatment) HPI  Past Medical History  Diagnosis Date  . CAD (coronary artery disease)     2009 inferior myocardial infarction. Previous stenting of his right coronary artery. Subsequently, he had stent thrombosis in the right coronary stent and failed to keep that vessel open even after restenting. He also had LAD 50-60% stenosis, mid to proximal, and OM1 40% stenosis. Well-preserved ejection fraction  . Hypertension   . Hyperlipidemia   . Prostate cancer     s/p resection in april 2005  . Constipation   . Hemorrhoids    Past Surgical History  Procedure Laterality Date  . Prostatectomy  2007   Family History  Problem Relation Age of Onset  . Heart disease    . Heart attack Father 56    laryngeal cancer and a tracheostomy  . Lung cancer Mother   . Mitral valve prolapse Sister   . Aneurysm Brother    Social History   Substance Use Topics  . Smoking status: Former Research scientist (life sciences)  . Smokeless tobacco: None  . Alcohol Use: No    Review of Systems  Constitutional: Negative for fever and chills.  HENT: Negative for congestion and sore throat.   Eyes: Negative for visual disturbance.  Respiratory: Negative for cough, shortness of breath and wheezing.   Cardiovascular: Negative for chest pain and palpitations.  Gastrointestinal: Negative for nausea, vomiting, abdominal pain, diarrhea, blood in stool and rectal pain.  Genitourinary: Positive for frequency and hematuria. Negative for dysuria, urgency, flank pain, decreased urine volume, discharge, penile swelling, scrotal swelling, difficulty urinating, genital sores, penile pain and testicular pain.  Musculoskeletal: Negative for back pain and neck pain.  Skin: Negative for rash.  Neurological: Negative for light-headedness and headaches.      Allergies  Review of patient's allergies indicates no known allergies.  Home Medications   Prior to Admission medications   Medication Sig Start Date End Date Taking? Authorizing Provider  acetaminophen-codeine (TYLENOL #3) 300-30 MG per tablet Take 1 tablet by mouth every 4 (four) hours as needed.      Historical Provider, MD  aspirin 81 MG tablet Take 81 mg by mouth daily.      Historical Provider, MD  cephALEXin (KEFLEX) 500 MG capsule Take 500 mg by mouth 4 (four) times daily.      Historical Provider, MD  Cholecalciferol (VITAMIN D3) 1000 UNITS CAPS Take 1 capsule by mouth daily.      Historical Provider, MD  clopidogrel (PLAVIX)  75 MG tablet Take 1 tablet (75 mg total) by mouth daily. 12/27/12   Minus Breeding, MD  fish oil-omega-3 fatty acids 1000 MG capsule Take 1 g by mouth daily.      Historical Provider, MD  loratadine (ALLERGY RELIEF) 10 MG tablet Take 10 mg by mouth daily.      Historical Provider, MD  metoprolol (LOPRESSOR) 50 MG tablet Take 1 tablet (50 mg total) by mouth as directed. 1/2 po bid 05/30/13  05/30/14  Minus Breeding, MD  NITROSTAT 0.4 MG SL tablet DISSOLVE 1 TAB UNDER TOUNGE FOR CHEST PAIN. MAY REPEAT EVERY 5 MINUTES FOR 3 DOSES. IF NO RELIEF CALL 911 OR GO TO ER 03/19/14   Minus Breeding, MD  rosuvastatin (CRESTOR) 20 MG tablet Take 1 tablet (20 mg total) by mouth daily. 02/24/12   Minus Breeding, MD   BP 120/79 mmHg  Pulse 73  Temp(Src) 98 F (36.7 C) (Oral)  Resp 16  Ht 5\' 9"  (1.753 m)  Wt 190 lb (86.183 kg)  BMI 28.05 kg/m2  SpO2 96% Physical Exam  Constitutional: He appears well-developed and well-nourished. No distress.  Nontoxic appearing.  HENT:  Head: Normocephalic and atraumatic.  Mouth/Throat: Oropharynx is clear and moist.  Eyes: Conjunctivae are normal. Pupils are equal, round, and reactive to light. Right eye exhibits no discharge. Left eye exhibits no discharge.  Neck: Neck supple.  Cardiovascular: Normal rate, regular rhythm, normal heart sounds and intact distal pulses.  Exam reveals no gallop and no friction rub.   Pulmonary/Chest: Effort normal and breath sounds normal. No respiratory distress. He has no wheezes. He has no rales.  Abdominal: Soft. Bowel sounds are normal. He exhibits no distension and no mass. There is no tenderness. There is no guarding.  Abdomen is soft and nontender to palpation. Bowel sounds are present.  Genitourinary: Testes normal and penis normal. Right testis shows no mass, no swelling and no tenderness. Left testis shows no mass, no swelling and no tenderness. Uncircumcised. No phimosis, paraphimosis, penile erythema or penile tenderness. No discharge found.  GU exam is unremarkable. No penile or scrotal tenderness. No penile discharge.  Musculoskeletal: He exhibits no edema.  Lymphadenopathy:    He has no cervical adenopathy.  Neurological: He is alert. Coordination normal.  Skin: Skin is warm and dry. No rash noted. He is not diaphoretic. No erythema. No pallor.  Psychiatric: He has a normal mood and affect. His behavior is  normal.  Nursing note and vitals reviewed.   ED Course  Procedures (including critical care time) Labs Review Labs Reviewed  COMPREHENSIVE METABOLIC PANEL - Abnormal; Notable for the following:    Glucose, Bld 100 (*)    BUN 22 (*)    Creatinine, Ser 1.32 (*)    GFR calc non Af Amer 52 (*)    All other components within normal limits  URINALYSIS, ROUTINE W REFLEX MICROSCOPIC (NOT AT [facility]) - Abnormal; Notable for the following:    Color, Urine RED (*)    APPearance TURBID (*)    Hgb urine dipstick LARGE (*)    Bilirubin Urine SMALL (*)    Ketones, ur 15 (*)    Protein, ur 100 (*)    Leukocytes, UA SMALL (*)    All other components within normal limits  URINE MICROSCOPIC-ADD ON - Abnormal; Notable for the following:    Bacteria, UA FEW (*)    Casts HYALINE CASTS (*)    All other components within normal limits  URINE CULTURE  LIPASE,  BLOOD  CBC    Imaging Review No results found. I have personally reviewed and evaluated these lab results as part of my medical decision-making.   EKG Interpretation None      Filed Vitals:   02/15/15 1607  BP: 120/79  Pulse: 73  Temp: 98 F (36.7 C)  TempSrc: Oral  Resp: 16  Height: 5\' 9"  (1.753 m)  Weight: 190 lb (86.183 kg)  SpO2: 96%     MDM   Final diagnoses:  Hematuria   This is a 74 y.o. male with a history of prostate cancer with prostatectomy, CAD on plavix and HTN who presents to the ED complaining of painless gross hematuria since today. The patient reports that he noticed bright red blood in is urine today about 4 times. He denies any dysuria or abdominal pain. He reports frequency of urination today. He denies urinary urgency or decreased urination. He denies trouble urinating. On exam the patient is afebrile nontoxic appearing. His abdomen is soft and nontender to palpation. GU exam is unremarkable. Mucous membranes are moist. The patient does not appear dehydrated. Urinalysis indicates large hemoglobin with small  leukocytes. Nitrite negative. This is not impressive enough for UTI, will send the urine for culture. Lipase is within normal limits. CMP indicates a creatinine of 1.32. This is an elevation from his previous blood work however his last creatinine was drawn in 2009. CBC is within normal limits. Hemoglobin and hematocrit are stable. I discussed this patient with Dr. Billy Fischer who feels that an emergent CT scan at this time is not necessary. Patient is followed by urology and we feel that the patient would receive better care with appropriate imaging done by his urologist. I discussed this with the patient and family and they agree to this plan. I discussed strict return precautions for the patient including the inability to urinate or trouble urinating, abdominal pain, fevers, new or worsening symptoms or new concerns. I advised the patient to follow-up with their primary care provider this week. I advised the patient to return to the emergency department with new or worsening symptoms or new concerns. The patient verbalized understanding and agreement with plan.     This patient was discussed with Dr. Billy Fischer who agrees with assessment and plan.     Waynetta Pean, PA-C 02/15/15 [place], MD 02/19/15 1904

## 2015-02-15 NOTE — ED Notes (Addendum)
Pt from home for eval of bright red blood in urine, pt also reports lower abd discomfort that started today. Pt states 4 episodes of hematuria, denies any back pain, n/v/d at this time. Pt does take plavix for cardiac hx. nad noted. Pt with hx of surgery to remove prostate.

## 2015-02-15 NOTE — ED Notes (Addendum)
Vital signs stable, alert and oriented, no acute distress noted, currently denies abdominal or back pain.  Resting peacefully in the bed, family at bedside.

## 2015-02-17 LAB — URINE CULTURE: CULTURE: NO GROWTH

## 2015-03-06 ENCOUNTER — Other Ambulatory Visit: Payer: Self-pay | Admitting: Urology

## 2015-03-07 ENCOUNTER — Encounter (HOSPITAL_COMMUNITY): Payer: Self-pay

## 2015-03-08 ENCOUNTER — Other Ambulatory Visit (HOSPITAL_COMMUNITY): Payer: Self-pay | Admitting: *Deleted

## 2015-03-08 ENCOUNTER — Encounter (HOSPITAL_COMMUNITY)
Admission: RE | Admit: 2015-03-08 | Discharge: 2015-03-08 | Disposition: A | Discharge status: Home / Self Care | Payer: PPO | Source: Ambulatory Visit | Attending: Urology | Admitting: Urology

## 2015-03-08 DIAGNOSIS — Z7982 Long term (current) use of aspirin: Secondary | ICD-10-CM | POA: Diagnosis not present

## 2015-03-08 DIAGNOSIS — Z955 Presence of coronary angioplasty implant and graft: Secondary | ICD-10-CM | POA: Diagnosis not present

## 2015-03-08 DIAGNOSIS — I2510 Atherosclerotic heart disease of native coronary artery without angina pectoris: Secondary | ICD-10-CM | POA: Diagnosis not present

## 2015-03-08 DIAGNOSIS — E785 Hyperlipidemia, unspecified: Secondary | ICD-10-CM | POA: Diagnosis not present

## 2015-03-08 DIAGNOSIS — R310 Gross hematuria: Secondary | ICD-10-CM | POA: Diagnosis present

## 2015-03-08 DIAGNOSIS — Z7902 Long term (current) use of antithrombotics/antiplatelets: Secondary | ICD-10-CM | POA: Diagnosis not present

## 2015-03-08 DIAGNOSIS — Z87891 Personal history of nicotine dependence: Secondary | ICD-10-CM | POA: Diagnosis not present

## 2015-03-08 DIAGNOSIS — Z8546 Personal history of malignant neoplasm of prostate: Secondary | ICD-10-CM | POA: Diagnosis not present

## 2015-03-08 DIAGNOSIS — K219 Gastro-esophageal reflux disease without esophagitis: Secondary | ICD-10-CM | POA: Diagnosis not present

## 2015-03-08 DIAGNOSIS — M1990 Unspecified osteoarthritis, unspecified site: Secondary | ICD-10-CM | POA: Diagnosis not present

## 2015-03-08 DIAGNOSIS — N200 Calculus of kidney: Secondary | ICD-10-CM | POA: Diagnosis not present

## 2015-03-08 DIAGNOSIS — I252 Old myocardial infarction: Secondary | ICD-10-CM | POA: Diagnosis not present

## 2015-03-08 DIAGNOSIS — Z79899 Other long term (current) drug therapy: Secondary | ICD-10-CM | POA: Diagnosis not present

## 2015-03-10 NOTE — H&P (Signed)
History of Present Illness Nicholas [name] who was last seen in 2012 by Dr. Jeffie Pollock. He was followed by him for prostate cancer and had undergone a prostatectomy in 2005. It was determined after several years of undetectable PSA, that he could be continued to followed by his primary care physician. He presents today after being evaluated in the ED on September 19 4 a four-day history of painless gross hematuria. It is associated with urinary frequency and feeling of bladder discomfort. Urine culture then was negative. It was determined that he would be better evaluated by urology and was discharged with follow-up information.     He states his gross hematuria has decreased and somewhat cleared today but the previous 3 days have been pretty significant. He denies weakness or feeling fatigued. There has never been any pain including flank or back. There has been no dysuria. He denies other signs of infection including fever.   Past Medical History Problems  1. History of Acute Myocardial Infarction 2. History of Anxiety (Symptom) 3. History of Anxiety (Symptom) 4. History of Cancer 5. History of Coronary Artery Disease 6. History of arthritis (Z87.39) 7. History of cardiac disorder (Z86.79) 8. History of esophageal reflux (Z87.19) 9. History of hypercholesterolemia (Z86.39) 10. History of hyperlipidemia (Z86.39) 11. Personal history of prostate cancer (Z85.46)  Surgical History Problems  1. History of Cath Stent Placement 2. History of Cystoscopy With Ureteroscopy 3. History of Knee Surgery 4. History of Lithotripsy 5. History of Nose Surgery 6. History of Prostatect Retropubic Radical W/ Bilat Pelv Lymphadenectomy  Current Meds 1. Allergy TABS;  Therapy: (Recorded:16Jul2012) to Recorded 2. Aspirin 81 MG TABS;  Therapy: (Recorded:21May2010) to Recorded 3. Atorvastatin Calcium 80 MG Oral Tablet;  Therapy: (Recorded:13Sep2016) to Recorded 4. Betamethasone Dipropionate 0.05 % External  Ointment;  Therapy: [id number] to Recorded 5. Fish Oil CAPS;  Therapy: (Recorded:21May2010) to Recorded 6. Metoprolol Tartrate 50 MG Oral Tablet;  Therapy: (Recorded:13Sep2016) to Recorded 7. Metoprolol Tartrate 50 MG Oral Tablet;  Therapy: (Recorded:18May2009) to Recorded 8. Multi-Day Vitamins TABS;  Therapy: (Recorded:21May2010) to Recorded 9. Plavix 75 MG Oral Tablet (Clopidogrel Bisulfate);  Therapy: (Recorded:19Nov2007) to Recorded 10. Vitamin B-12 TABS;   Therapy: (Recorded:13Sep2016) to Recorded  Allergies Medication  1. No Known Drug Allergies  Family History Problems  1. Family history of Acute Myocardial Infarction : Father 2. Family history of Cancer : Father 3. Family history of Family Health Status Number Of Children   1 - daughter 4. Family history of lung cancer (Z80.1) : Mother 5. Family history of myocardial infarction (Z82.49) : Father 6. Family history of Lung Cancer : Mother 7. Family history of Renal Failure : Mother  Social History Problems    Denied: Alcohol Use   Denied: History of Caffeine Use   1 cup coffee   Family history of Death In The Family Father   age 45 Heart Attack   Family history of Death In The Family Mother   age 32 - Lung Cancer   Former smoker (Z87.891)   Marital History - Currently Married   Retired   Tobacco Use  Review of Systems Genitourinary, constitutional, skin, eye, otolaryngeal, hematologic/lymphatic, cardiovascular, pulmonary, endocrine, musculoskeletal, gastrointestinal, neurological and psychiatric system(s) were reviewed and pertinent findings if present are noted and are otherwise negative.  Genitourinary: urinary frequency and hematuria.    Vitals Vital Signs [Data Includes: Last 1 Day]  Recorded: 13Sep2016 11:13AM  Height: 5 ft 9 in Weight: 190 lb  BMI Calculated: 28.06 BSA Calculated: 2.02 Blood Pressure:  120 / 82 Temperature: 97.2 F Heart Rate: 69  Physical Exam Constitutional: Well  nourished and well developed . No acute distress.  ENT:. The ears and nose are normal in appearance.  Neck: The appearance of the neck is normal and no neck mass is present.  Pulmonary: No respiratory distress and normal respiratory rhythm and effort.  Cardiovascular: Heart rate and rhythm are normal . No peripheral edema.  Abdomen: The abdomen is soft and nontender. No masses are palpated. No CVA tenderness. No hernias are palpable. No hepatosplenomegaly noted.  Genitourinary: Examination of the penis demonstrates no discharge, no masses, no lesions and a normal meatus. The scrotum is without lesions. The right epididymis is palpably normal and non-tender. The left epididymis is palpably normal and non-tender. The right testis is non-tender and without masses. The left testis is non-tender and without masses.  Lymphatics: The femoral and inguinal nodes are not enlarged or tender.  Skin: Normal skin turgor, no visible rash and no visible skin lesions.  Neuro/Psych:. Mood and affect are appropriate.    Results/Data Urine [Data Includes: Last 1 Day]   13Sep2016  COLOR RED   APPEARANCE CLOUDY   SPECIFIC GRAVITY 1.020   pH 6.5   GLUCOSE NEGATIVE   BILIRUBIN NEGATIVE   KETONE NEGATIVE   BLOOD 3+   PROTEIN NEGATIVE   NITRITE NEGATIVE   LEUKOCYTE ESTERASE NEGATIVE   SQUAMOUS EPITHELIAL/HPF 0-5 HPF  WBC 0-5 WBC/HPF  RBC >60 RBC/HPF  BACTERIA FEW HPF  CRYSTALS NONE SEEN HPF  CASTS NONE SEEN LPF  Yeast NONE SEEN HPF   The following clinical lab reports were reviewed:  Urinalysis with greater than 60 red blood cells per high-powered field and few bacteria noted. Sent for culture.    Assessment Assessed  1. Bacteriuria (N39.0) 2. Gross hematuria (R31.0) 3. Encounter for preventive health examination (Z00.00) 4. Status post prostatectomy (Z90.79)  Plan Gross hematuria  1. BUN & CREATININE; Status:Complete;   Done: [id number] 11:44AM 2. VENIPUNCTURE; Status:Complete;   Done:  [id number] 3. Follow-up After Test Office  Follow-up with Dr Jeffie Pollock for cysto after his CT scan.  Status:  Hold For - Date of Service  Requested for: 17Oct2016 Health Maintenance  4. UA With REFLEX; [Do Not Release]; Status:Complete;   Done: [id number] 10:54AM  Discussion/Summary I explained to the patient the need for a hematuria workup as he is a former smoker.. I will obtain renal function today and have him return for a CT scan with delayed renal images. He will then be referred back to Dr. Jeffie Pollock for cystoscopy. He understands to follow-up sooner should his symptoms worsen including inability to urinate or fever.   CT showed a right renal pelvic stone that is the probable cause of the bleeding,   We will proceed with ESWL to treat the stone.  Risks reviewed.

## 2015-03-11 ENCOUNTER — Ambulatory Visit (HOSPITAL_COMMUNITY): Payer: PPO

## 2015-03-11 ENCOUNTER — Encounter (HOSPITAL_COMMUNITY)
Admission: RE | Disposition: A | Discharge status: Home / Self Care | Payer: Self-pay | Source: Ambulatory Visit | Attending: Urology

## 2015-03-11 ENCOUNTER — Ambulatory Visit (HOSPITAL_COMMUNITY)
Admission: RE | Admit: 2015-03-11 | Discharge: 2015-03-11 | Disposition: A | Discharge status: Home / Self Care | Payer: PPO | Source: Ambulatory Visit | Attending: Urology | Admitting: Urology

## 2015-03-11 ENCOUNTER — Encounter (HOSPITAL_COMMUNITY): Payer: Self-pay | Admitting: General Practice

## 2015-03-11 DIAGNOSIS — R310 Gross hematuria: Secondary | ICD-10-CM | POA: Diagnosis present

## 2015-03-11 DIAGNOSIS — E7800 Pure hypercholesterolemia, unspecified: Secondary | ICD-10-CM | POA: Insufficient documentation

## 2015-03-11 DIAGNOSIS — N200 Calculus of kidney: Secondary | ICD-10-CM | POA: Insufficient documentation

## 2015-03-11 DIAGNOSIS — Z8546 Personal history of malignant neoplasm of prostate: Secondary | ICD-10-CM | POA: Diagnosis not present

## 2015-03-11 DIAGNOSIS — Z79899 Other long term (current) drug therapy: Secondary | ICD-10-CM | POA: Insufficient documentation

## 2015-03-11 DIAGNOSIS — I252 Old myocardial infarction: Secondary | ICD-10-CM | POA: Diagnosis not present

## 2015-03-11 DIAGNOSIS — Z87891 Personal history of nicotine dependence: Secondary | ICD-10-CM | POA: Insufficient documentation

## 2015-03-11 DIAGNOSIS — Z955 Presence of coronary angioplasty implant and graft: Secondary | ICD-10-CM | POA: Diagnosis not present

## 2015-03-11 DIAGNOSIS — Z7902 Long term (current) use of antithrombotics/antiplatelets: Secondary | ICD-10-CM | POA: Diagnosis not present

## 2015-03-11 DIAGNOSIS — I2510 Atherosclerotic heart disease of native coronary artery without angina pectoris: Secondary | ICD-10-CM | POA: Diagnosis not present

## 2015-03-11 DIAGNOSIS — K219 Gastro-esophageal reflux disease without esophagitis: Secondary | ICD-10-CM | POA: Insufficient documentation

## 2015-03-11 DIAGNOSIS — M1990 Unspecified osteoarthritis, unspecified site: Secondary | ICD-10-CM | POA: Insufficient documentation

## 2015-03-11 DIAGNOSIS — E785 Hyperlipidemia, unspecified: Secondary | ICD-10-CM | POA: Diagnosis not present

## 2015-03-11 DIAGNOSIS — Z7982 Long term (current) use of aspirin: Secondary | ICD-10-CM | POA: Insufficient documentation

## 2015-03-11 HISTORY — DX: Chronic kidney disease, unspecified: N18.9

## 2015-03-11 SURGERY — LITHOTRIPSY, ESWL
Anesthesia: LOCAL | Laterality: Right

## 2015-03-11 MED ORDER — SODIUM CHLORIDE 0.9 % IV SOLN
INTRAVENOUS | Status: DC
  Administered 2015-03-11: 09:00:00 via INTRAVENOUS

## 2015-03-11 MED ORDER — SODIUM CHLORIDE 0.9 % IJ SOLN: 3.0000 mL | Freq: Two times a day (BID) | INTRAMUSCULAR | Status: DC

## 2015-03-11 MED ORDER — METOPROLOL TARTRATE 50 MG PO TABS: 25.0000 mg | ORAL_TABLET | Freq: Two times a day (BID) | ORAL | Status: DC

## 2015-03-11 MED ORDER — ACETAMINOPHEN 325 MG PO TABS: 650.0000 mg | ORAL_TABLET | ORAL | Status: DC | PRN

## 2015-03-11 MED ORDER — OXYCODONE HCL 5 MG PO TABS: 5.0000 mg | ORAL_TABLET | ORAL | Status: DC | PRN

## 2015-03-11 MED ORDER — ACETAMINOPHEN 650 MG RE SUPP
650.0000 mg | RECTAL | Status: DC | PRN
  Filled 2015-03-11: qty 1

## 2015-03-11 MED ORDER — FENTANYL CITRATE (PF) 100 MCG/2ML IJ SOLN: 25.0000 ug | INTRAMUSCULAR | Status: DC | PRN

## 2015-03-11 MED ORDER — SODIUM CHLORIDE 0.9 % IV SOLN: 250.0000 mL | INTRAVENOUS | Status: DC | PRN

## 2015-03-11 MED ORDER — OXYCODONE-ACETAMINOPHEN 5-325 MG PO TABS: 1.0000 | ORAL_TABLET | ORAL | Status: DC | PRN

## 2015-03-11 MED ORDER — DIAZEPAM 5 MG PO TABS
10.0000 mg | ORAL_TABLET | ORAL | Status: AC
  Administered 2015-03-11: 10 mg via ORAL
  Filled 2015-03-11: qty 2

## 2015-03-11 MED ORDER — DIPHENHYDRAMINE HCL 25 MG PO CAPS
25.0000 mg | ORAL_CAPSULE | ORAL | Status: AC
  Administered 2015-03-11: 25 mg via ORAL
  Filled 2015-03-11: qty 1

## 2015-03-11 MED ORDER — SODIUM CHLORIDE 0.9 % IJ SOLN: 3.0000 mL | INTRAMUSCULAR | Status: DC | PRN

## 2015-03-11 MED ORDER — CIPROFLOXACIN HCL 500 MG PO TABS
500.0000 mg | ORAL_TABLET | ORAL | Status: AC
  Administered 2015-03-11: 500 mg via ORAL
  Filled 2015-03-11: qty 1

## 2015-03-11 NOTE — Discharge Instructions (Signed)
Lithotripsy, Care After °Refer to this sheet in the next few weeks. These instructions provide you with information on caring for yourself after your procedure. Your health care provider may also give you more specific instructions. Your treatment has been planned according to current medical practices, but problems sometimes occur. Call your health care provider if you have any problems or questions after your procedure. °WHAT TO EXPECT AFTER THE PROCEDURE  °· Your urine may have a red tinge for a few days after treatment. Blood loss is usually minimal. °· You may have soreness in the back or flank area. This usually goes away after a few days. The procedure can cause blotches or bruises on the back where the pressure wave enters the skin. These marks usually cause only minimal discomfort and should disappear in a short time. °· Stone fragments should begin to pass within 24 hours of treatment. However, a delayed passage is not unusual. °· You may have pain, discomfort, and feel sick to your stomach (nauseated) when the crushed fragments of stone are passed down the tube from the kidney to the bladder. Stone fragments can pass soon after the procedure and may last for up to 4-8 weeks. °· A small number of patients may have severe pain when stone fragments are not able to pass, which leads to an obstruction. °· If your stone is greater than 1 inch (2.5 cm) in diameter or if you have multiple stones that have a combined diameter greater than 1 inch (2.5 cm), you may require more than one treatment. °· If you had a stent placed prior to your procedure, you may experience some discomfort, especially during urination. You may experience the pain or discomfort in your flank or back, or you may experience a sharp pain or discomfort at the base of your penis or in your lower abdomen. The discomfort usually lasts only a few minutes after urinating. °HOME CARE INSTRUCTIONS  °· Rest at home until you feel your energy  improving. °· Only take over-the-counter or prescription medicines for pain, discomfort, or fever as directed by your health care provider. Depending on the type of lithotripsy, you may need to take antibiotics and anti-inflammatory medicines for a few days. °· Drink enough water and fluids to keep your urine clear or pale yellow. This helps "flush" your kidneys. It helps pass any remaining pieces of stone and prevents stones from coming back. °· Most people can resume daily activities within 1-2 days after standard lithotripsy. It can take longer to recover from laser and percutaneous lithotripsy. °· If the stones are in your urinary system, you may be asked to strain your urine at home to look for stones. Any stones that are found can be sent to a medical lab for examination. °· Visit your health care provider for a follow-up appointment in a few weeks. Your doctor may remove your stent if you have one. Your health care provider will also check to see whether stone particles still remain. °SEEK MEDICAL CARE IF:  °· Your pain is not relieved by medicine. °· You have a lasting nauseous feeling. °· You feel there is too much blood in the urine. °· You develop persistent problems with frequent or painful urination that does not at least partially improve after 2 days following the procedure. °· You have a congested cough. °· You feel lightheaded. °· You develop a rash or any other signs that might suggest an allergic problem. °· You develop any reaction or side effects to   your medicine(s). SEEK IMMEDIATE MEDICAL CARE IF:   You experience severe back or flank pain or both.  You see nothing but blood when you urinate.  You cannot pass any urine at all.  You have a fever or shaking chills.  You develop shortness of breath, difficulty breathing, or chest pain.  You develop vomiting that will not stop after 6-8 hours.  You have a fainting episode. Document Released: 06/14/2007 Document Revised: 03/15/2013  Document Reviewed: 12/08/2012 [facility] Patient Information 2015 [place], [place]. This information is not intended to replace advice given to you by your health care provider. Make sure you discuss any questions you have with your health care provider.   We will have you come in this Weds for a renal US to make sure there is no significant bleeding before you resume the plavix and aspirin.   Don't restart those until we clear you.

## 2015-03-11 NOTE — Interval H&P Note (Signed)
History and Physical Interval Note:  03/11/2015 11:58 AM  Nicholas [name]  has presented today for surgery, with the diagnosis of RIGHT RENAL PELVIC LITHOTHIASIS GROSS HEMATURIA   The various methods of treatment have been discussed with the patient and family. After consideration of risks, benefits and other options for treatment, the patient has consented to  Procedure(s): RIGHT EXTRACORPOREAL SHOCK WAVE LITHOTRIPSY (ESWL) (Right) as a surgical intervention .  The patient's history has been reviewed, patient examined, no change in status, stable for surgery.  I have reviewed the patient's chart and labs.  Questions were answered to the patient's satisfaction.     Dorine Duffey J

## 2015-07-30 DIAGNOSIS — Z8614 Personal history of Methicillin resistant Staphylococcus aureus infection: Secondary | ICD-10-CM | POA: Diagnosis not present

## 2015-07-30 DIAGNOSIS — J309 Allergic rhinitis, unspecified: Secondary | ICD-10-CM | POA: Diagnosis not present

## 2015-07-30 DIAGNOSIS — R319 Hematuria, unspecified: Secondary | ICD-10-CM | POA: Diagnosis not present

## 2015-07-30 DIAGNOSIS — Z0000 Encounter for general adult medical examination without abnormal findings: Secondary | ICD-10-CM | POA: Diagnosis not present

## 2015-07-30 DIAGNOSIS — E785 Hyperlipidemia, unspecified: Secondary | ICD-10-CM | POA: Diagnosis not present

## 2015-07-30 DIAGNOSIS — I10 Essential (primary) hypertension: Secondary | ICD-10-CM | POA: Diagnosis not present

## 2015-07-30 DIAGNOSIS — G4700 Insomnia, unspecified: Secondary | ICD-10-CM | POA: Diagnosis not present

## 2015-07-30 DIAGNOSIS — L308 Other specified dermatitis: Secondary | ICD-10-CM | POA: Diagnosis not present

## 2015-07-30 DIAGNOSIS — I2510 Atherosclerotic heart disease of native coronary artery without angina pectoris: Secondary | ICD-10-CM | POA: Diagnosis not present

## 2015-07-30 DIAGNOSIS — Z87891 Personal history of nicotine dependence: Secondary | ICD-10-CM | POA: Diagnosis not present

## 2015-07-30 DIAGNOSIS — Z79899 Other long term (current) drug therapy: Secondary | ICD-10-CM | POA: Diagnosis not present

## 2015-07-30 DIAGNOSIS — Z8546 Personal history of malignant neoplasm of prostate: Secondary | ICD-10-CM | POA: Diagnosis not present

## 2015-08-14 DIAGNOSIS — J309 Allergic rhinitis, unspecified: Secondary | ICD-10-CM | POA: Diagnosis not present

## 2015-08-14 DIAGNOSIS — R350 Frequency of micturition: Secondary | ICD-10-CM | POA: Diagnosis not present

## 2015-10-26 DIAGNOSIS — H40033 Anatomical narrow angle, bilateral: Secondary | ICD-10-CM | POA: Diagnosis not present

## 2015-10-26 DIAGNOSIS — H2513 Age-related nuclear cataract, bilateral: Secondary | ICD-10-CM | POA: Diagnosis not present

## 2015-12-11 DIAGNOSIS — L239 Allergic contact dermatitis, unspecified cause: Secondary | ICD-10-CM | POA: Diagnosis not present

## 2016-01-03 ENCOUNTER — Encounter: Payer: Self-pay | Admitting: Podiatry

## 2016-01-03 ENCOUNTER — Ambulatory Visit (INDEPENDENT_AMBULATORY_CARE_PROVIDER_SITE_OTHER): Payer: PPO | Admitting: Podiatry

## 2016-01-03 ENCOUNTER — Ambulatory Visit (INDEPENDENT_AMBULATORY_CARE_PROVIDER_SITE_OTHER): Payer: PPO

## 2016-01-03 VITALS — BP 127/78 | HR 62 | Resp 14

## 2016-01-03 DIAGNOSIS — M722 Plantar fascial fibromatosis: Secondary | ICD-10-CM

## 2016-01-03 MED ORDER — MELOXICAM 15 MG PO TABS: 15.0000 mg | ORAL_TABLET | Freq: Every day | ORAL | 0 refills | Status: DC

## 2016-01-03 NOTE — Progress Notes (Signed)
   Subjective:    Patient ID: Nicholas PHILIPPS, male    DOB: [birth year], 75 y.o.   MRN: [id number]  HPI this patient presents to the office with chief complaint of a painful left heel. He states the pain has been present for approximately 2 weeks says he experiences pain upon rising in the morning and standing from a sitting position. He states he has taken ibuprofen to help control the pain and it has helped.  He denies any history of trauma or injury to the foot. No self treatment has been afforded. He presents the office today for an evaluation and treatment of this condition    Review of Systems  All other systems reviewed and are negative.      Objective:   Physical Exam GENERAL APPEARANCE: Alert, conversant. Appropriately groomed. No acute distress.  VASCULAR: Pedal pulses are  palpable at  [facility] and PT bilateral.  Capillary refill time is immediate to all digits,  Normal temperature gradient.  Digital hair growth is present bilateral  NEUROLOGIC: sensation is normal to 5.07 monofilament at 5/5 sites bilateral.  Light touch is intact bilateral, Muscle strength normal.  MUSCULOSKELETAL: acceptable muscle strength, tone and stability bilateral.  Intrinsic muscluature intact bilateral.  Rectus appearance of foot and digits noted bilateral. Palpable pain at the inside border left heel.  There is swelling noted left heel.  DERMATOLOGIC: skin color, texture, and turgor are within normal limits.  No preulcerative lesions or ulcers  are seen, no interdigital maceration noted.  No open lesions present.  Digital nails are asymptomatic. No drainage noted.         Assessment & Plan:  Plantar fascitis left foot.   IE  Xray taken reeals calcification at plantar fascial insertion.  Discussed condition with patient.  Recommended powerstep insoles.  Prescribed Mobic po.  Injection therapy left heel.  RTC 2 weeks.   Gardiner Barefoot DPM

## 2016-01-17 ENCOUNTER — Ambulatory Visit (INDEPENDENT_AMBULATORY_CARE_PROVIDER_SITE_OTHER): Payer: PPO | Admitting: Podiatry

## 2016-01-17 ENCOUNTER — Encounter: Payer: Self-pay | Admitting: Podiatry

## 2016-01-17 DIAGNOSIS — M722 Plantar fascial fibromatosis: Secondary | ICD-10-CM

## 2016-01-17 NOTE — Progress Notes (Signed)
This patient presents to the office 2 weeks ago with a painful left heel. He was diagnosed with plantar fasciitis/heel spur syndrome, left foot. He was treated with injection therapy power steps as well as Mobic. He says he is 100% better and he is not having pain and discomfort walking at work. He is very pleased with his improvement  GENERAL APPEARANCE: Alert, conversant. Appropriately groomed. No acute distress.  VASCULAR: Pedal pulses are  palpable at  [facility] and PT bilateral.  Capillary refill time is immediate to all digits,  Normal temperature gradient.  Digital hair growth is present bilateral  NEUROLOGIC: sensation is normal to 5.07 monofilament at 5/5 sites bilateral.  Light touch is intact bilateral, Muscle strength normal.  MUSCULOSKELETAL: acceptable muscle strength, tone and stability bilateral.  Intrinsic muscluature intact bilateral.  Rectus appearance of foot and digits noted bilateral. There is persistant swelling and increased left heel with no pain at insertion plantar fascia left foot.  DERMATOLOGIC: skin color, texture, and turgor are within normal limits.  No preulcerative lesions or ulcers  are seen, no interdigital maceration noted.  No open lesions present.  Digital nails are asymptomatic. No drainage noted.  Plantar fascitis left foot.   ROV>  Told him to take Mobic for swelling and heel inflammation.  RTC prn.   Gardiner Barefoot DPM

## 2016-01-28 DIAGNOSIS — Z125 Encounter for screening for malignant neoplasm of prostate: Secondary | ICD-10-CM | POA: Diagnosis not present

## 2016-01-28 DIAGNOSIS — Z8546 Personal history of malignant neoplasm of prostate: Secondary | ICD-10-CM | POA: Diagnosis not present

## 2016-01-28 DIAGNOSIS — E785 Hyperlipidemia, unspecified: Secondary | ICD-10-CM | POA: Diagnosis not present

## 2016-01-28 DIAGNOSIS — I2510 Atherosclerotic heart disease of native coronary artery without angina pectoris: Secondary | ICD-10-CM | POA: Diagnosis not present

## 2016-04-17 ENCOUNTER — Ambulatory Visit (INDEPENDENT_AMBULATORY_CARE_PROVIDER_SITE_OTHER): Payer: PPO | Admitting: Podiatry

## 2016-04-17 DIAGNOSIS — M722 Plantar fascial fibromatosis: Secondary | ICD-10-CM | POA: Insufficient documentation

## 2016-04-17 HISTORY — DX: Plantar fascial fibromatosis: M72.2

## 2016-04-17 NOTE — Progress Notes (Signed)
Subjective: Nicholas [name] presents to the office today for follow-up evaluation of left heel pain. They state that they are doing better but still having some lingering pain. He has been wearing the powersteps which helped and the injection helped. He has not been stretching or icing.  No other complaints at this time. No acute changes since last appointment. They deny any systemic complaints such as fevers, chills, nausea, vomiting.  Objective: General: AAO x3, NAD  Dermatological: Skin is warm, dry and supple bilateral. Nails x 10 are well manicured; remaining integument appears unremarkable at this time. There are no open sores, no preulcerative lesions, no rash or signs of infection present.  Vascular: Dorsalis Pedis artery and Posterior Tibial artery pedal pulses are 2/4 bilateral with immedate capillary fill time. Pedal hair growth present. There is no pain with calf compression, swelling, warmth, erythema.   Neruologic: Grossly intact via light touch bilateral. Vibratory intact via tuning fork bilateral.   Musculoskeletal: There is subjectively improved yet continued tenderness palpation along the plantar medial tubercle of the calcaneus at the insertion of the plantar fascia on the left foot. There is no pain along the course of the plantar fascia within the arch of the foot. Plantar fascia appears to be intact bilaterally. There is no pain with lateral compression of the calcaneus and there is no pain with vibratory sensation. There is no pain along the course or insertion of the Achilles tendon. There are no other areas of tenderness to bilateral lower extremities. No gross boney pedal deformities bilateral. No pain, crepitus, or limitation noted with foot and ankle range of motion bilateral. Muscular strength 5/5 in all groups tested bilateral.  Gait: Unassisted, Nonantalgic.   Assessment: Presents for follow-up evaluation for heel pain, likely plantar fasciitis   Plan: -Treatment  options discussed including all alternatives, risks, and complications -Patient elects to proceed with steroid injection into the left heel. Under sterile skin preparation, a total of 2.5cc of kenalog 10, 0.5% Marcaine plain, and 2% lidocaine plain were infiltrated into the symptomatic area without complication. A band-aid was applied. Patient tolerated the injection well without complication. Post-injection care with discussed with the patient. Discussed with the patient to ice the area over the next couple of days to help prevent a steroid flare.  -Ice and stretching exercises on a daily basis. -Continue supportive shoe gear. Has powersteps  -Follow-up in 4 weeks if symptoms continue or sooner if any problems arise. In the meantime, encouraged to call the office with any questions, concerns, change in symptoms.   Celesta Gentile, DPM

## 2016-04-17 NOTE — Patient Instructions (Signed)

## 2016-07-27 ENCOUNTER — Encounter: Payer: Self-pay | Admitting: Cardiology

## 2016-07-27 DIAGNOSIS — Z0000 Encounter for general adult medical examination without abnormal findings: Secondary | ICD-10-CM | POA: Diagnosis not present

## 2016-07-27 DIAGNOSIS — Z8546 Personal history of malignant neoplasm of prostate: Secondary | ICD-10-CM | POA: Diagnosis not present

## 2016-07-27 DIAGNOSIS — E785 Hyperlipidemia, unspecified: Secondary | ICD-10-CM | POA: Diagnosis not present

## 2016-07-27 DIAGNOSIS — I2510 Atherosclerotic heart disease of native coronary artery without angina pectoris: Secondary | ICD-10-CM | POA: Diagnosis not present

## 2016-07-27 DIAGNOSIS — I10 Essential (primary) hypertension: Secondary | ICD-10-CM | POA: Diagnosis not present

## 2016-08-04 DIAGNOSIS — L570 Actinic keratosis: Secondary | ICD-10-CM | POA: Diagnosis not present

## 2016-08-04 DIAGNOSIS — L308 Other specified dermatitis: Secondary | ICD-10-CM | POA: Diagnosis not present

## 2016-08-04 DIAGNOSIS — L821 Other seborrheic keratosis: Secondary | ICD-10-CM | POA: Diagnosis not present

## 2016-08-31 DIAGNOSIS — J029 Acute pharyngitis, unspecified: Secondary | ICD-10-CM | POA: Diagnosis not present

## 2016-08-31 DIAGNOSIS — R05 Cough: Secondary | ICD-10-CM | POA: Diagnosis not present

## 2016-10-24 DIAGNOSIS — H2513 Age-related nuclear cataract, bilateral: Secondary | ICD-10-CM | POA: Diagnosis not present

## 2016-10-24 DIAGNOSIS — H40033 Anatomical narrow angle, bilateral: Secondary | ICD-10-CM | POA: Diagnosis not present

## 2016-11-30 ENCOUNTER — Encounter: Payer: Self-pay | Admitting: Cardiology

## 2016-11-30 DIAGNOSIS — E785 Hyperlipidemia, unspecified: Secondary | ICD-10-CM | POA: Diagnosis not present

## 2016-11-30 DIAGNOSIS — Z8546 Personal history of malignant neoplasm of prostate: Secondary | ICD-10-CM | POA: Diagnosis not present

## 2016-11-30 DIAGNOSIS — I10 Essential (primary) hypertension: Secondary | ICD-10-CM | POA: Diagnosis not present

## 2016-11-30 DIAGNOSIS — I2510 Atherosclerotic heart disease of native coronary artery without angina pectoris: Secondary | ICD-10-CM | POA: Diagnosis not present

## 2016-12-15 DIAGNOSIS — Z77122 Contact with and (suspected) exposure to noise: Secondary | ICD-10-CM | POA: Diagnosis not present

## 2016-12-15 DIAGNOSIS — H90A21 Sensorineural hearing loss, unilateral, right ear, with restricted hearing on the contralateral side: Secondary | ICD-10-CM | POA: Diagnosis not present

## 2016-12-15 DIAGNOSIS — H90A32 Mixed conductive and sensorineural hearing loss, unilateral, left ear with restricted hearing on the contralateral side: Secondary | ICD-10-CM | POA: Diagnosis not present

## 2017-01-08 DIAGNOSIS — I2510 Atherosclerotic heart disease of native coronary artery without angina pectoris: Secondary | ICD-10-CM | POA: Diagnosis not present

## 2017-01-08 DIAGNOSIS — R21 Rash and other nonspecific skin eruption: Secondary | ICD-10-CM | POA: Diagnosis not present

## 2017-04-09 DIAGNOSIS — Z23 Encounter for immunization: Secondary | ICD-10-CM | POA: Diagnosis not present

## 2017-07-04 ENCOUNTER — Other Ambulatory Visit: Payer: Self-pay | Admitting: Cardiology

## 2017-08-20 ENCOUNTER — Encounter: Payer: Self-pay | Admitting: Cardiology

## 2017-10-01 ENCOUNTER — Telehealth: Payer: Self-pay

## 2017-10-01 NOTE — Telephone Encounter (Signed)
Notes sent to schedule from [place] [facility] [place] office.

## 2017-10-07 ENCOUNTER — Ambulatory Visit (INDEPENDENT_AMBULATORY_CARE_PROVIDER_SITE_OTHER): Payer: Medicare HMO

## 2017-10-07 ENCOUNTER — Ambulatory Visit: Payer: Medicare HMO | Admitting: Podiatry

## 2017-10-07 ENCOUNTER — Encounter: Payer: Self-pay | Admitting: Podiatry

## 2017-10-07 DIAGNOSIS — M779 Enthesopathy, unspecified: Secondary | ICD-10-CM

## 2017-10-07 DIAGNOSIS — M7741 Metatarsalgia, right foot: Secondary | ICD-10-CM | POA: Diagnosis not present

## 2017-10-07 DIAGNOSIS — M7751 Other enthesopathy of right foot: Secondary | ICD-10-CM

## 2017-10-11 NOTE — Progress Notes (Signed)
Subjective: Nicholas [name] presents the office with concerns of right foot pain into the ball of his foot he says the area is painful with pressure in shoes. He denies any numbness or tingling to the toes.  He denies any recent injury or trauma he is under treatment for this.  He states that it hurts underneath the second toe area. Denies any systemic complaints such as fevers, chills, nausea, vomiting. No acute changes since last appointment, and no other complaints at this time.   Objective: AAO x3, NAD DP/PT pulses palpable bilaterally, CRT less than 3 seconds Tenderness palpation submetatarsal 2 as well as the second interspace but there is no palpable neuroma identified today.  Minimal swelling but there is no erythema or increase in warmth.  There is no area pinpoint bony tenderness or pain to vibratory sensation.  There is no other areas of tenderness identified at this time. No open lesions or pre-ulcerative lesions.  No pain with calf compression, swelling, warmth, erythema  Assessment: Capsulitis right foot, metatarsalgia  Plan: -All treatment options discussed with the patient including all alternatives, risks, complications.  -X-rays were obtained and reviewed.  No evidence of acute fracture or stress fracture identified today. -Steroid injection was performed today.  See procedure note below. -Metatarsal offloading pads dispensed -Ice to the area daily -Patient encouraged to call the office with any questions, concerns, change in symptoms.   Procedure: Injection  Discussed alternatives, risks, complications and verbal consent was obtained.  Location: Right second MTPJ Skin Prep: Betadine. Injectate: 0.5cc 0.5% marcaine plain, 0.5 cc 2% lidocaine plain and, 1 cc kenalog 10. Disposition: Patient tolerated procedure well. Injection site dressed with a band-aid.  Post-injection care was discussed and return precautions discussed.

## 2017-11-24 ENCOUNTER — Encounter: Payer: Self-pay | Admitting: *Deleted

## 2017-11-27 NOTE — Progress Notes (Signed)
Cardiology Office Note   Date:  12/02/2017   ID:  Nicholas [name], DOB 30-Dec-1940, MRN [id number]  PCP:  Maurice Small, MD  Cardiologist:   No primary care provider on file. Referring:  Maurice Small, MD  Chief Complaint  Patient presents with  . Coronary Artery Disease      History of Present Illness: Nicholas [name] is a 77 y.o. male who presents for follow up of CAD.  I last saw him in 2012.  Since I last saw him he is done well.  He is very active.  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  This includes riding a lawnmower, trimming with a lawnmower, wheezing, putting up fences, cutting down trees.  The patient denies any new symptoms such as chest discomfort, neck or arm discomfort. There has been no new shortness of breath, PND or orthopnea. There have been no reported palpitations, presyncope or syncope.   Past Medical History:  Diagnosis Date  . CAD (coronary artery disease)    2009 inferior myocardial infarction. Previous stenting of his right coronary artery. Subsequently, he had stent thrombosis in the right coronary stent and failed to keep that vessel open even after restenting. He also had LAD 50-60% stenosis, mid to proximal, and OM1 40% stenosis. Well-preserved ejection fraction  . Chronic kidney disease    kidney stones  . Constipation   . Hemorrhoids   . Hyperlipidemia   . Hypertension   . Plantar fasciitis of left foot 04/17/2016  . Prostate cancer [facility])    s/p resection in april 2005    Past Surgical History:  Procedure Laterality Date  . PROSTATECTOMY  2007     Current Outpatient Medications  Medication Sig Dispense Refill  . aspirin 81 MG tablet Take 81 mg by mouth daily.      Marland Kitchen atorvastatin (LIPITOR) 80 MG tablet Take 80 mg by mouth daily.    . Cholecalciferol (VITAMIN D3) 1000 UNITS CAPS Take 1 capsule by mouth daily.      . clopidogrel (PLAVIX) 75 MG tablet  Take 1 tablet (75 mg total) by mouth daily. (Patient taking differently: Take 75 mg by mouth at bedtime. ) 30 tablet 0  . diphenhydramine-acetaminophen (TYLENOL PM) 25-500 MG TABS tablet Take 1 tablet by mouth at bedtime as needed (FOR PAIN/SLEEP).    . fish oil-omega-3 fatty acids 1000 MG capsule Take 1 g by mouth 2 (two) times daily.     Marland Kitchen loratadine (ALLERGY RELIEF) 10 MG tablet Take 10 mg by mouth daily as needed for allergies.     . metoprolol tartrate (LOPRESSOR) 50 MG tablet Take by mouth 2 (two) times daily. Pt takes 12.5 mg 2 times daily    . nitroGLYCERIN (NITROSTAT) 0.4 MG SL tablet DISSOLVE 1 TAB UNDER TOUNGE FOR CHEST PAIN. MAY REPEAT EVERY 5 MINUTES FOR 3 DOSES. IF NO RELIEF CALL 911 OR GO TO ER 25 tablet 6   No current facility-administered medications for this visit.     Allergies:   Patient has no known allergies.    Social History:  The patient  reports that he has quit smoking. His smoking use included cigarettes. He has never used smokeless tobacco. He reports that he does not drink alcohol or use drugs.   Family History:  The patient's family history includes Aneurysm in his sister; Cancer in his brother; Heart attack (age of onset:  64) in his father; Heart disease in his unknown relative; Lung cancer in his brother and mother.    ROS:  Please see the history of present illness.   Otherwise, review of systems are positive for none.   All other systems are reviewed and negative.    PHYSICAL EXAM: VS:  BP 122/84   Pulse 63   Ht 5\' 9"  (1.753 m)   Wt 194 lb 3.2 oz (88.1 kg)   BMI 28.68 kg/m  , BMI Body mass index is 28.68 kg/m. GENERAL:  Well appearing and looks younger than stated age. HEENT:  Pupils equal round and reactive, fundi not visualized, oral mucosa unremarkable NECK:  No jugular venous distention, waveform within normal limits, carotid upstroke brisk and symmetric, no bruits, no thyromegaly LYMPHATICS:  No cervical, inguinal adenopathy LUNGS:  Clear to  auscultation bilaterally BACK:  No CVA tenderness CHEST:  Unremarkable HEART:  PMI not displaced or sustained,S1 and S2 within normal limits, no S3, no S4, no clicks, no rubs, no murmurs ABD:  Flat, positive bowel sounds normal in frequency in pitch, no bruits, no rebound, no guarding, no midline pulsatile mass, no hepatomegaly, no splenomegaly EXT:  2 plus pulses throughout, no edema, no cyanosis no clubbing SKIN:  No rashes no nodules NEURO:  Cranial nerves II through XII grossly intact, motor grossly intact throughout PSYCH:  Cognitively intact, oriented to person place and time    EKG:  EKG is ordered today. The ekg ordered today demonstrates sinus rhythm, rate 63, axis within normal limits, intervals within normal limits, old inferior infarct.   Recent Labs: No results found for requested labs within last 8760 hours.    Lipid Panel    Component Value Date/Time   CHOL (H) 09/28/2007 0500    205        ATP III CLASSIFICATION:  <200     mg/dL   Desirable  200-239  mg/dL   Borderline High  >=240    mg/dL   High   TRIG 145 09/28/2007 0500   HDL 32 (L) 09/28/2007 0500   CHOLHDL 6.4 09/28/2007 0500   VLDL 29 09/28/2007 0500   LDLCALC (H) 09/28/2007 0500    144        Total Cholesterol/HDL:CHD Risk Coronary Heart Disease Risk Table                     Men   Women  1/2 Average Risk   3.4   3.3      Wt Readings from Last 3 Encounters:  12/02/17 194 lb 3.2 oz (88.1 kg)  03/11/15 195 lb (88.5 kg)  02/15/15 190 lb (86.2 kg)      Other studies Reviewed: Additional studies/ records that were reviewed today include: Labs. Review of the above records demonstrates:  Please see elsewhere in the note.     ASSESSMENT AND PLAN:  CAD:  The patient has no new sypmtoms.  No further cardiovascular testing is indicated.  We will continue with aggressive risk reduction and meds as listed.  HTN:  The blood pressure is at target. No change in medications is indicated. We will  continue with therapeutic lifestyle changes (TLC).  DYSLIPIDEMIA:  His LDL was 72 and HDL was 32 this year.  He will continue meds as listed.     AAA:  He was a smoker and will have screening.    Current medicines are reviewed at length with the patient today.  The patient does not have  concerns regarding medicines.  The following changes have been made:  no change  Labs/ tests ordered today include:  No orders of the defined types were placed in this encounter.    Disposition:   FU with me in one year.     Signed, Minus Breeding, MD  12/02/2017 8:25 AM    [facility]

## 2017-12-02 ENCOUNTER — Encounter: Payer: Self-pay | Admitting: Cardiology

## 2017-12-02 ENCOUNTER — Ambulatory Visit: Payer: Medicare HMO | Admitting: Cardiology

## 2017-12-02 VITALS — BP 122/84 | HR 63 | Ht 69.0 in | Wt 194.2 lb

## 2017-12-02 DIAGNOSIS — I10 Essential (primary) hypertension: Secondary | ICD-10-CM | POA: Diagnosis not present

## 2017-12-02 DIAGNOSIS — E785 Hyperlipidemia, unspecified: Secondary | ICD-10-CM

## 2017-12-02 DIAGNOSIS — I2510 Atherosclerotic heart disease of native coronary artery without angina pectoris: Secondary | ICD-10-CM | POA: Diagnosis not present

## 2017-12-02 DIAGNOSIS — Z87891 Personal history of nicotine dependence: Secondary | ICD-10-CM | POA: Diagnosis not present

## 2017-12-02 NOTE — Patient Instructions (Addendum)
Medication Instructions:  Continue current medications  If you need a refill on your cardiac medications before your next appointment, please call your pharmacy.  Labwork: None Ordered   Testing/Procedures: Your physician has requested that you have an abdominal aorta duplex. During this test, an ultrasound is used to evaluate the aorta. Allow 30 minutes for this exam. Do not eat after midnight the day before and avoid carbonated beverages   Follow-Up: Your physician wants you to follow-up in: 1 Year.You should receive a reminder letter in the mail two months in advance. If you do not receive a letter, please call our office [phone].      Thank you for choosing [facility] at [facility]!!

## 2017-12-06 ENCOUNTER — Other Ambulatory Visit: Payer: Self-pay | Admitting: Cardiology

## 2017-12-06 DIAGNOSIS — Z87891 Personal history of nicotine dependence: Secondary | ICD-10-CM

## 2017-12-06 DIAGNOSIS — I700 Atherosclerosis of aorta: Secondary | ICD-10-CM

## 2017-12-06 DIAGNOSIS — I708 Atherosclerosis of other arteries: Secondary | ICD-10-CM

## 2017-12-06 NOTE — Addendum Note (Signed)
Addended by: Zebedee Iba on: 12/06/2017 11:08 AM   Modules accepted: Orders

## 2017-12-14 ENCOUNTER — Ambulatory Visit (HOSPITAL_COMMUNITY)
Admission: RE | Admit: 2017-12-14 | Discharge: 2017-12-14 | Disposition: A | Discharge status: Home / Self Care | Payer: Medicare HMO | Source: Ambulatory Visit | Attending: Cardiovascular Disease | Admitting: Cardiovascular Disease

## 2017-12-14 DIAGNOSIS — I708 Atherosclerosis of other arteries: Secondary | ICD-10-CM

## 2017-12-14 DIAGNOSIS — I700 Atherosclerosis of aorta: Secondary | ICD-10-CM | POA: Diagnosis not present

## 2017-12-14 DIAGNOSIS — Z87891 Personal history of nicotine dependence: Secondary | ICD-10-CM | POA: Insufficient documentation

## 2018-09-13 ENCOUNTER — Other Ambulatory Visit: Payer: Self-pay | Admitting: Cardiology

## 2018-09-13 MED ORDER — NITROGLYCERIN 0.4 MG SL SUBL
SUBLINGUAL_TABLET | SUBLINGUAL | 2 refills | Status: DC
Start: 2018-09-13 — End: 2022-12-02

## 2018-09-13 NOTE — Telephone Encounter (Signed)
NTG SL refilled. 

## 2018-09-13 NOTE — Telephone Encounter (Signed)
° ° ° °*  STAT* If patient is at the pharmacy, call can be transferred to refill team.   1. Which medications need to be refilled? (please list name of each medication and dose if known) nitroGLYCERIN (NITROSTAT) 0.4 MG SL tablet  2. Which pharmacy/location (including street and city if local pharmacy) is medication to be sent to?[place], [place]  3. Do they need a 30 day or 90 day supply?

## 2018-11-01 ENCOUNTER — Telehealth: Payer: Self-pay | Admitting: *Deleted

## 2018-11-01 NOTE — Telephone Encounter (Signed)
A message was left, re: follow up visit. 

## 2018-12-13 ENCOUNTER — Telehealth: Payer: Self-pay | Admitting: *Deleted

## 2018-12-13 NOTE — Telephone Encounter (Signed)
A message was left, re: follow up visit. 

## 2018-12-19 ENCOUNTER — Ambulatory Visit: Payer: Self-pay | Admitting: Cardiology

## 2019-03-07 ENCOUNTER — Telehealth: Payer: Self-pay | Admitting: Cardiology

## 2019-03-07 NOTE — Telephone Encounter (Signed)
Patient will wait until the pandemic is over to come to see Dr. Percival Spanish.

## 2019-05-22 DIAGNOSIS — Z7189 Other specified counseling: Secondary | ICD-10-CM | POA: Insufficient documentation

## 2019-05-22 DIAGNOSIS — I714 Abdominal aortic aneurysm, without rupture, unspecified: Secondary | ICD-10-CM | POA: Insufficient documentation

## 2019-05-22 NOTE — Progress Notes (Deleted)
Cardiology Office Note   Date:  05/22/2019   ID:  Nicholas [name], DOB July 17, 1940, MRN [id number]  PCP:  Maurice Small, MD  Cardiologist:   No primary care provider on file. Referring:  Maurice Small, MD  No chief complaint on file.     History of Present Illness: Nicholas [name] is a 78 y.o. male who presents for follow up of CAD.   Since I last saw him ***   *** I last saw him in 2012.  Since I last saw him he is done well.  He is very active.  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  This includes riding a lawnmower, trimming with a lawnmower, wheezing, putting up fences, cutting down trees.  The patient denies any new symptoms such as chest discomfort, neck or arm discomfort. There has been no new shortness of breath, PND or orthopnea. There have been no reported palpitations, presyncope or syncope.   Past Medical History:  Diagnosis Date  . CAD (coronary artery disease)    2009 inferior myocardial infarction. Previous stenting of his right coronary artery. Subsequently, he had stent thrombosis in the right coronary stent and failed to keep that vessel open even after restenting. He also had LAD 50-60% stenosis, mid to proximal, and OM1 40% stenosis. Well-preserved ejection fraction  . Chronic kidney disease    kidney stones  . Constipation   . Hemorrhoids   . Hyperlipidemia   . Hypertension   . Plantar fasciitis of left foot 04/17/2016  . Prostate cancer [facility])    s/p resection in april 2005    Past Surgical History:  Procedure Laterality Date  . PROSTATECTOMY  2007     Current Outpatient Medications  Medication Sig Dispense Refill  . aspirin 81 MG tablet Take 81 mg by mouth daily.      Marland Kitchen atorvastatin (LIPITOR) 80 MG tablet Take 80 mg by mouth daily.    . Cholecalciferol (VITAMIN D3) 1000 UNITS CAPS Take 1 capsule by mouth daily.      . clopidogrel (PLAVIX) 75 MG tablet Take 1  tablet (75 mg total) by mouth daily. (Patient taking differently: Take 75 mg by mouth at bedtime. ) 30 tablet 0  . diphenhydramine-acetaminophen (TYLENOL PM) 25-500 MG TABS tablet Take 1 tablet by mouth at bedtime as needed (FOR PAIN/SLEEP).    . fish oil-omega-3 fatty acids 1000 MG capsule Take 1 g by mouth 2 (two) times daily.     Marland Kitchen loratadine (ALLERGY RELIEF) 10 MG tablet Take 10 mg by mouth daily as needed for allergies.     . metoprolol tartrate (LOPRESSOR) 50 MG tablet Take by mouth 2 (two) times daily. Pt takes 12.5 mg 2 times daily    . nitroGLYCERIN (NITROSTAT) 0.4 MG SL tablet DISSOLVE 1 TAB UNDER TOUNGE FOR CHEST PAIN. MAY REPEAT EVERY 5 MINUTES FOR 3 DOSES. IF NO RELIEF CALL 911 OR GO TO ER 25 tablet 2   No current facility-administered medications for this visit.    Allergies:   Patient has no known allergies.    ROS:  Please see the history of present illness.   Otherwise, review of systems are positive for ***.   All other systems are reviewed and negative.    PHYSICAL EXAM: VS:  There were no vitals taken for this visit. , BMI There is no height or weight on file to calculate  BMI. GENERAL:  Well appearing NECK:  No jugular venous distention, waveform within normal limits, carotid upstroke brisk and symmetric, no bruits, no thyromegaly LUNGS:  Clear to auscultation bilaterally CHEST:  Unremarkable HEART:  PMI not displaced or sustained,S1 and S2 within normal limits, no S3, no S4, no clicks, no rubs, *** murmurs ABD:  Flat, positive bowel sounds normal in frequency in pitch, no bruits, no rebound, no guarding, no midline pulsatile mass, no hepatomegaly, no splenomegaly EXT:  2 plus pulses throughout, no edema, no cyanosis no clubbing   ***GENERAL:  Well appearing and looks younger than stated age. HEENT:  Pupils equal round and reactive, fundi not visualized, oral mucosa unremarkable NECK:  No jugular venous distention, waveform within normal limits, carotid upstroke brisk  and symmetric, no bruits, no thyromegaly LYMPHATICS:  No cervical, inguinal adenopathy LUNGS:  Clear to auscultation bilaterally BACK:  No CVA tenderness CHEST:  Unremarkable HEART:  PMI not displaced or sustained,S1 and S2 within normal limits, no S3, no S4, no clicks, no rubs, no murmurs ABD:  Flat, positive bowel sounds normal in frequency in pitch, no bruits, no rebound, no guarding, no midline pulsatile mass, no hepatomegaly, no splenomegaly EXT:  2 plus pulses throughout, no edema, no cyanosis no clubbing SKIN:  No rashes no nodules NEURO:  Cranial nerves II through XII grossly intact, motor grossly intact throughout PSYCH:  Cognitively intact, oriented to person place and time   EKG:  EKG is *** ordered today. The ekg ordered today demonstrates sinus rhythm, rate ***, axis within normal limits, intervals within normal limits, old inferior infarct.   Recent Labs: No results found for requested labs within last 8760 hours.    Lipid Panel    Component Value Date/Time   CHOL (H) 09/28/2007 0500    205        ATP III CLASSIFICATION:  <200     mg/dL   Desirable  200-239  mg/dL   Borderline High  >=240    mg/dL   High   TRIG 145 09/28/2007 0500   HDL 32 (L) 09/28/2007 0500   CHOLHDL 6.4 09/28/2007 0500   VLDL 29 09/28/2007 0500   LDLCALC (H) 09/28/2007 0500    144        Total Cholesterol/HDL:CHD Risk Coronary Heart Disease Risk Table                     Men   Women  1/2 Average Risk   3.4   3.3      Wt Readings from Last 3 Encounters:  12/02/17 194 lb 3.2 oz (88.1 kg)  03/11/15 195 lb (88.5 kg)  02/15/15 190 lb (86.2 kg)      Other studies Reviewed: Additional studies/ records that were reviewed today include: *** Review of the above records demonstrates:  Please see elsewhere in the note.     ASSESSMENT AND PLAN:  CAD: *** The patient has no new sypmtoms.  No further cardiovascular testing is indicated.  We will continue with aggressive risk reduction and  meds as listed.  HTN:  The blood pressure is *** at target. No change in medications is indicated. We will continue with therapeutic lifestyle changes (TLC).  DYSLIPIDEMIA:  His LDL was *** and HDL was 32 this year.  He will continue meds as listed.     AAA:  *** He was a smoker and will have screening.   COVID EDUCATION: :  ***    Current medicines are reviewed  at length with the patient today.  The patient does not have concerns regarding medicines.  The following changes have been made:  no change  Labs/ tests ordered today include:  No orders of the defined types were placed in this encounter.    Disposition:   FU with me in one year.     Signed, Minus Breeding, MD  05/22/2019 10:15 PM    [facility]

## 2019-05-24 ENCOUNTER — Ambulatory Visit: Payer: Self-pay | Admitting: Cardiology

## 2019-05-24 DIAGNOSIS — Z7189 Other specified counseling: Secondary | ICD-10-CM

## 2019-05-24 DIAGNOSIS — I2510 Atherosclerotic heart disease of native coronary artery without angina pectoris: Secondary | ICD-10-CM

## 2019-05-24 DIAGNOSIS — E785 Hyperlipidemia, unspecified: Secondary | ICD-10-CM

## 2019-05-24 DIAGNOSIS — I10 Essential (primary) hypertension: Secondary | ICD-10-CM

## 2019-05-24 DIAGNOSIS — I714 Abdominal aortic aneurysm, without rupture: Secondary | ICD-10-CM

## 2019-05-25 NOTE — Progress Notes (Signed)
Cardiology Office Note   Date:  05/27/2019   ID:  [name], DOB 03/09/1941, MRN [id number]  PCP:  Maurice Small, MD  Cardiologist:   No primary care provider on file. Referring:  Maurice Small, MD  Chief Complaint  Patient presents with  . Coronary Artery Disease      History of Present Illness: Nicholas [name] is a 78 y.o. male who presents for follow up of CAD.   Since I last saw him he has done well.  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  The patient denies any new symptoms such as chest discomfort, neck or arm discomfort. There has been no new shortness of breath, PND or orthopnea. There have been no reported palpitations, presyncope or syncope.    Past Medical History:  Diagnosis Date  . CAD (coronary artery disease)    2009 inferior myocardial infarction. Previous stenting of his right coronary artery. Subsequently, he had stent thrombosis in the right coronary stent and failed to keep that vessel open even after restenting. He also had LAD 50-60% stenosis, mid to proximal, and OM1 40% stenosis. Well-preserved ejection fraction  . Chronic kidney disease    kidney stones  . Constipation   . Hemorrhoids   . Hyperlipidemia   . Hypertension   . Plantar fasciitis of left foot 04/17/2016  . Prostate cancer [facility])    s/p resection in april 2005    Past Surgical History:  Procedure Laterality Date  . PROSTATECTOMY  2007     Current Outpatient Medications  Medication Sig Dispense Refill  . aspirin 81 MG tablet Take 81 mg by mouth daily.      Marland Kitchen atorvastatin (LIPITOR) 80 MG tablet Take 80 mg by mouth daily.    . Cholecalciferol (VITAMIN D3) 1000 UNITS CAPS Take 1 capsule by mouth daily.      . clopidogrel (PLAVIX) 75 MG tablet Take 1 tablet (75 mg total) by mouth daily. (Patient taking differently: Take 75 mg by mouth at bedtime. ) 30 tablet 0  .  diphenhydramine-acetaminophen (TYLENOL PM) 25-500 MG TABS tablet Take 1 tablet by mouth at bedtime as needed (FOR PAIN/SLEEP).    . fish oil-omega-3 fatty acids 1000 MG capsule Take 1 g by mouth 2 (two) times daily.     Marland Kitchen loratadine (ALLERGY RELIEF) 10 MG tablet Take 10 mg by mouth daily as needed for allergies.     . metoprolol tartrate (LOPRESSOR) 50 MG tablet Take by mouth 2 (two) times daily. Pt takes 12.5 mg 2 times daily    . nitroGLYCERIN (NITROSTAT) 0.4 MG SL tablet DISSOLVE 1 TAB UNDER TOUNGE FOR CHEST PAIN. MAY REPEAT EVERY 5 MINUTES FOR 3 DOSES. IF NO RELIEF CALL 911 OR GO TO ER 25 tablet 2   No current facility-administered medications for this visit.    Allergies:   Patient has no known allergies.    ROS:  Please see the history of present illness.   Otherwise, review of systems are positive for none.   All other systems are reviewed and negative.    PHYSICAL EXAM: VS:  BP 119/77   Pulse 64   Ht 5\' 9"  (1.753 m)   Wt 208 lb (94.3 kg)   SpO2 99%   BMI 30.72 kg/m  , BMI Body mass index is 30.72 kg/m. GENERAL:  Well appearing NECK:  No jugular venous distention, waveform within normal limits, carotid upstroke brisk and symmetric, no bruits, no thyromegaly LUNGS:  Clear to auscultation bilaterally CHEST:  Unremarkable HEART:  PMI not displaced or sustained,S1 and S2 within normal limits, no S3, no S4, no clicks, no rubs, no murmurs ABD:  Flat, positive bowel sounds normal in frequency in pitch, no bruits, no rebound, no guarding, no midline pulsatile mass, no hepatomegaly, no splenomegaly EXT:  2 plus pulses throughout, no edema, no cyanosis no clubbing   EKG:  EKG is  ordered today. The ekg ordered today demonstrates sinus rhythm, rate 64, axis within normal limits, intervals within normal limits, old inferior infarct.   Recent Labs: No results found for requested labs within last 8760 hours.     Wt Readings from Last 3 Encounters:  05/26/19 208 lb (94.3 kg)   12/02/17 194 lb 3.2 oz (88.1 kg)  03/11/15 195 lb (88.5 kg)      Other studies Reviewed: Additional studies/ records that were reviewed today include: None Review of the above records demonstrates:  Please see elsewhere in the note.     ASSESSMENT AND PLAN:  CAD:  The patient has no new sypmtoms.  No further cardiovascular testing is indicated.  We will continue with aggressive risk reduction and meds as listed.  HTN:  The blood pressure is at target.  No change in therapy.   DYSLIPIDEMIA: He does not have a recent lipid profile and he will get this drawn by his primary provider.   AAA:   He had a negative screening last year.   COVID EDUCATION: :   He is interested in getting the vaccine we did talk about this today.   Current medicines are reviewed at length with the patient today.  The patient does not have concerns regarding medicines.  The following changes have been made:  no change  Labs/ tests ordered today include:   Orders Placed This Encounter  Procedures  . EKG 12-Lead     Disposition:   FU with me in 1.5 yeasr.     Signed, Minus Breeding, MD  05/27/2019 12:22 PM    [facility]

## 2019-05-26 ENCOUNTER — Other Ambulatory Visit: Payer: Self-pay

## 2019-05-26 ENCOUNTER — Encounter (INDEPENDENT_AMBULATORY_CARE_PROVIDER_SITE_OTHER): Payer: Self-pay

## 2019-05-26 ENCOUNTER — Ambulatory Visit: Payer: Medicare Other | Admitting: Cardiology

## 2019-05-26 ENCOUNTER — Encounter: Payer: Self-pay | Admitting: Cardiology

## 2019-05-26 VITALS — BP 119/77 | HR 64 | Ht 69.0 in | Wt 208.0 lb

## 2019-05-26 DIAGNOSIS — E785 Hyperlipidemia, unspecified: Secondary | ICD-10-CM | POA: Diagnosis not present

## 2019-05-26 DIAGNOSIS — I714 Abdominal aortic aneurysm, without rupture, unspecified: Secondary | ICD-10-CM

## 2019-05-26 DIAGNOSIS — I10 Essential (primary) hypertension: Secondary | ICD-10-CM

## 2019-05-26 DIAGNOSIS — I2510 Atherosclerotic heart disease of native coronary artery without angina pectoris: Secondary | ICD-10-CM | POA: Diagnosis not present

## 2019-05-26 DIAGNOSIS — Z7189 Other specified counseling: Secondary | ICD-10-CM

## 2019-05-26 NOTE — Patient Instructions (Signed)
Medication Instructions:  Your physician recommends that you continue on your current medications as directed. Please refer to the Current Medication list given to you today. *If you need a refill on your cardiac medications before your next appointment, please call your pharmacy*  Lab Work: none If you have labs (blood work) drawn today and your tests are completely normal, you will receive your results only by: Marland Kitchen MyChart Message (if you have MyChart) OR . A paper copy in the mail If you have any lab test that is abnormal or we need to change your treatment, we will call you to review the results.  Testing/Procedures: none  Follow-Up: At [facility], you and your health needs are our priority.  As part of our continuing mission to provide you with exceptional heart care, we have created designated Provider Care Teams.  These Care Teams include your primary Cardiologist (physician) and Advanced Practice Providers (APPs -  Physician Assistants and Nurse Practitioners) who all work together to provide you with the care you need, when you need it.  Your next appointment:   12 month(s)  The format for your next appointment:   Either In Person or Virtual  Provider:   Minus Breeding, MD

## 2019-05-27 ENCOUNTER — Encounter: Payer: Self-pay | Admitting: Cardiology

## 2019-12-22 ENCOUNTER — Other Ambulatory Visit: Payer: Self-pay | Admitting: Family Medicine

## 2019-12-22 DIAGNOSIS — R0600 Dyspnea, unspecified: Secondary | ICD-10-CM

## 2019-12-22 DIAGNOSIS — Z87891 Personal history of nicotine dependence: Secondary | ICD-10-CM

## 2020-01-09 ENCOUNTER — Other Ambulatory Visit: Payer: Medicare Other

## 2020-01-12 ENCOUNTER — Ambulatory Visit
Admission: RE | Admit: 2020-01-12 | Discharge: 2020-01-12 | Disposition: A | Discharge status: Home / Self Care | Payer: Medicare HMO | Source: Ambulatory Visit | Attending: Family Medicine | Admitting: Family Medicine

## 2020-01-12 ENCOUNTER — Other Ambulatory Visit: Payer: Self-pay | Admitting: Family Medicine

## 2020-01-12 DIAGNOSIS — Z87891 Personal history of nicotine dependence: Secondary | ICD-10-CM

## 2020-01-12 DIAGNOSIS — R0600 Dyspnea, unspecified: Secondary | ICD-10-CM

## 2020-01-16 ENCOUNTER — Ambulatory Visit
Admission: RE | Admit: 2020-01-16 | Discharge: 2020-01-16 | Disposition: A | Discharge status: Home / Self Care | Payer: Medicare HMO | Source: Ambulatory Visit | Attending: Family Medicine | Admitting: Family Medicine

## 2020-01-16 DIAGNOSIS — R0600 Dyspnea, unspecified: Secondary | ICD-10-CM

## 2020-01-16 DIAGNOSIS — Z87891 Personal history of nicotine dependence: Secondary | ICD-10-CM

## 2020-05-15 NOTE — Progress Notes (Signed)
Cardiology Office Note   Date:  05/16/2020   ID:  GROVE DEFINA, DOB July 06, 1940, MRN [id number]  PCP:  No primary care provider on file.  Cardiologist:   No primary care provider on file. Referring:  No primary care provider on file.  Chief Complaint  Patient presents with  . Coronary Artery Disease      History of Present Illness: Nicholas [name] is a 79 y.o. male who presents for follow up of CAD.   Since I last saw him he has done well.  He was chopping wood for his neighbor recently.  He did have one episode of shortness of breath earlier this summer when he was weed eating and he had to go get evaluated.  I see a CT that mentions aortic atherosclerosis but there was no other cause.  He is otherwise done quite well. The patient denies any new symptoms such as chest discomfort, neck or arm discomfort. There has been no new shortness of breath, PND or orthopnea. There have been no reported palpitations, presyncope or syncope.    Past Medical History:  Diagnosis Date  . CAD (coronary artery disease)    2009 inferior myocardial infarction. Previous stenting of his right coronary artery. Subsequently, he had stent thrombosis in the right coronary stent and failed to keep that vessel open even after restenting. He also had LAD 50-60% stenosis, mid to proximal, and OM1 40% stenosis. Well-preserved ejection fraction  . Chronic kidney disease    kidney stones  . Hemorrhoids   . Hyperlipidemia   . Hypertension   . Plantar fasciitis of left foot 04/17/2016  . Prostate cancer [facility])    s/p resection in april 2005    Past Surgical History:  Procedure Laterality Date  . PROSTATECTOMY  2007     Current Outpatient Medications  Medication Sig Dispense Refill  . aspirin 81 MG tablet Take 81 mg by mouth daily.    Marland Kitchen atorvastatin (LIPITOR) 80 MG tablet Take 80 mg by mouth daily.    . Cholecalciferol (VITAMIN D3) 1000 UNITS CAPS Take 1 capsule by mouth daily.    . clopidogrel (PLAVIX)  75 MG tablet Take 1 tablet (75 mg total) by mouth daily. (Patient taking differently: Take 75 mg by mouth at bedtime.) 30 tablet 0  . diphenhydramine-acetaminophen (TYLENOL PM) 25-500 MG TABS tablet Take 1 tablet by mouth at bedtime as needed (FOR PAIN/SLEEP).    . famotidine (PEPCID) 20 MG tablet Take 1 tablet by mouth daily.    . fish oil-omega-3 fatty acids 1000 MG capsule Take 1 g by mouth 2 (two) times daily.    Marland Kitchen ketoconazole (NIZORAL) 2 % cream as needed.    . loratadine (CLARITIN) 10 MG tablet Take 10 mg by mouth daily as needed for allergies.    . nitroGLYCERIN (NITROSTAT) 0.4 MG SL tablet DISSOLVE 1 TAB UNDER TOUNGE FOR CHEST PAIN. MAY REPEAT EVERY 5 MINUTES FOR 3 DOSES. IF NO RELIEF CALL 911 OR GO TO ER 25 tablet 2  . SPIRIVA HANDIHALER 18 MCG inhalation capsule as needed.    Marland Kitchen losartan (COZAAR) 25 MG tablet Take 1 tablet (25 mg total) by mouth daily. 90 tablet 3  . metoprolol tartrate (LOPRESSOR) 25 MG tablet Take 1 tablet (25 mg total) by mouth 2 (two) times daily. 180 tablet 3   No current facility-administered medications for this visit.    Allergies:   Patient has no known allergies.    ROS:  Please see the  history of present illness.   Otherwise, review of systems are positive for none.   All other systems are reviewed and negative.    PHYSICAL EXAM: VS:  BP (!) 160/100   Pulse (!) 57   Ht 5\' 9"  (1.753 m)   Wt 209 lb (94.8 kg)   BMI 30.86 kg/m  , BMI Body mass index is 30.86 kg/m. GENERAL:  Well appearing NECK:  No jugular venous distention, waveform within normal limits, carotid upstroke brisk and symmetric, no bruits, no thyromegaly LUNGS:  Clear to auscultation bilaterally CHEST:  Unremarkable HEART:  PMI not displaced or sustained,S1 and S2 within normal limits, no S3, no S4, no clicks, no rubs, no murmurs ABD:  Flat, positive bowel sounds normal in frequency in pitch, no bruits, no rebound, no guarding, no midline pulsatile mass, no hepatomegaly, no  splenomegaly EXT:  2 plus pulses throughout, no edema, no cyanosis no clubbing    EKG:  EKG is not ordered today. The ekg ordered today demonstrates sinus rhythm, rate 57 axis within normal limits, intervals within normal limits, old inferior infarct.   Recent Labs: No results found for requested labs within last 8760 hours.     Wt Readings from Last 3 Encounters:  05/16/20 209 lb (94.8 kg)  05/26/19 208 lb (94.3 kg)  12/02/17 194 lb 3.2 oz (88.1 kg)      Other studies Reviewed: Additional studies/ records that were reviewed today include: Labs Review of the above records demonstrates:  Please see elsewhere in the note.     ASSESSMENT AND PLAN:  CAD:  The patient has no new sypmtoms.  No further cardiovascular testing is indicated.  We will continue with aggressive risk reduction and meds as listed.  HTN:  The blood pressure is elevated.  I am going to add Cozaar 25 mg daily.  They are going to get a new blood pressure cuff and keep a diary and mail the results to me.  Of note he had normal creatinine and potassium done recently was primary care office  DYSLIPIDEMIA:    LDL 70, HDL 43.  No change in therapy.     Current medicines are reviewed at length with the patient today.  The patient does not have concerns regarding medicines.  The following changes have been made: As above  Labs/ tests ordered today include: None  Orders Placed This Encounter  Procedures  . EKG 12-Lead     Disposition:   FU with me in 12 months.     Signed, Minus Breeding, MD  05/16/2020 9:58 AM    [facility]

## 2020-05-16 ENCOUNTER — Encounter: Payer: Self-pay | Admitting: Cardiology

## 2020-05-16 ENCOUNTER — Ambulatory Visit: Payer: Medicare HMO | Admitting: Cardiology

## 2020-05-16 ENCOUNTER — Other Ambulatory Visit: Payer: Self-pay

## 2020-05-16 VITALS — BP 160/100 | HR 57 | Ht 69.0 in | Wt 209.0 lb

## 2020-05-16 DIAGNOSIS — E785 Hyperlipidemia, unspecified: Secondary | ICD-10-CM

## 2020-05-16 DIAGNOSIS — I2510 Atherosclerotic heart disease of native coronary artery without angina pectoris: Secondary | ICD-10-CM | POA: Diagnosis not present

## 2020-05-16 DIAGNOSIS — I10 Essential (primary) hypertension: Secondary | ICD-10-CM | POA: Diagnosis not present

## 2020-05-16 DIAGNOSIS — I714 Abdominal aortic aneurysm, without rupture, unspecified: Secondary | ICD-10-CM

## 2020-05-16 MED ORDER — METOPROLOL TARTRATE 25 MG PO TABS: 25.0000 mg | ORAL_TABLET | Freq: Two times a day (BID) | ORAL | 3 refills | Status: AC

## 2020-05-16 MED ORDER — LOSARTAN POTASSIUM 25 MG PO TABS: 25.0000 mg | ORAL_TABLET | Freq: Every day | ORAL | 3 refills | Status: DC

## 2020-05-16 NOTE — Patient Instructions (Addendum)
Medication Instructions:  Start Cozaar 25mg  daily [facility] [place] *If you need a refill on your cardiac medications before your next appointment, please call your pharmacy*  Lab Work: None ordered this visit  Testing/Procedures: None ordered this visit  Follow-Up: At [facility], you and your health needs are our priority.  As part of our continuing mission to provide you with exceptional heart care, we have created designated Provider Care Teams.  These Care Teams include your primary Cardiologist (physician) and Advanced Practice Providers (APPs -  Physician Assistants and Nurse Practitioners) who all work together to provide you with the care you need, when you need it.  Your next appointment:   12 month(s)  You will receive a reminder letter in the mail two months in advance. If you don't receive a letter, please call our office to schedule the follow-up appointment.  The format for your next appointment:   In Person  Provider:   Minus Breeding, MD  Other Instructions OMRON BLOOD PRESSURE CUFF Mail BP log to: [address]

## 2020-07-19 DIAGNOSIS — R059 Cough, unspecified: Secondary | ICD-10-CM | POA: Diagnosis not present

## 2020-09-21 DIAGNOSIS — E669 Obesity, unspecified: Secondary | ICD-10-CM | POA: Diagnosis not present

## 2020-09-21 DIAGNOSIS — L309 Dermatitis, unspecified: Secondary | ICD-10-CM | POA: Diagnosis not present

## 2020-09-21 DIAGNOSIS — Z7902 Long term (current) use of antithrombotics/antiplatelets: Secondary | ICD-10-CM | POA: Diagnosis not present

## 2020-09-21 DIAGNOSIS — I252 Old myocardial infarction: Secondary | ICD-10-CM | POA: Diagnosis not present

## 2020-09-21 DIAGNOSIS — I10 Essential (primary) hypertension: Secondary | ICD-10-CM | POA: Diagnosis not present

## 2020-09-21 DIAGNOSIS — Z6830 Body mass index (BMI) 30.0-30.9, adult: Secondary | ICD-10-CM | POA: Diagnosis not present

## 2020-09-21 DIAGNOSIS — Z803 Family history of malignant neoplasm of breast: Secondary | ICD-10-CM | POA: Diagnosis not present

## 2020-09-21 DIAGNOSIS — Z7982 Long term (current) use of aspirin: Secondary | ICD-10-CM | POA: Diagnosis not present

## 2020-09-21 DIAGNOSIS — I2510 Atherosclerotic heart disease of native coronary artery without angina pectoris: Secondary | ICD-10-CM | POA: Diagnosis not present

## 2020-09-21 DIAGNOSIS — E785 Hyperlipidemia, unspecified: Secondary | ICD-10-CM | POA: Diagnosis not present

## 2020-11-21 DIAGNOSIS — H5200 Hypermetropia, unspecified eye: Secondary | ICD-10-CM | POA: Diagnosis not present

## 2020-11-21 DIAGNOSIS — E7800 Pure hypercholesterolemia, unspecified: Secondary | ICD-10-CM | POA: Diagnosis not present

## 2020-11-21 DIAGNOSIS — Z0100 Encounter for examination of eyes and vision without abnormal findings: Secondary | ICD-10-CM | POA: Diagnosis not present

## 2021-01-07 DIAGNOSIS — K920 Hematemesis: Secondary | ICD-10-CM | POA: Diagnosis not present

## 2021-05-10 ENCOUNTER — Other Ambulatory Visit: Payer: Self-pay | Admitting: Cardiology

## 2021-05-18 NOTE — Progress Notes (Signed)
Cardiology Office Note   Date:  05/20/2021   ID:  Nicholas [name], DOB Nov 05, 1940, MRN [id number]  PCP:  Pcp, No  Cardiologist:   None Referring:  Pcp, No  Chief Complaint  Patient presents with   Coronary Artery Disease       History of Present Illness: Nicholas [name] is a 80 y.o. male who presents for follow up of CAD.   Since I last saw him he has done very well.  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  He denies any chest pressure, discomfort.  He has no palpitations, presyncope or syncope.  He has no shortness of breath, PND or orthopnea.  He has had no weight gain or edema.   Past Medical History:  Diagnosis Date   CAD (coronary artery disease)    2009 inferior myocardial infarction. Previous stenting of his right coronary artery. Subsequently, he had stent thrombosis in the right coronary stent and failed to keep that vessel open even after restenting. He also had LAD 50-60% stenosis, mid to proximal, and OM1 40% stenosis. Well-preserved ejection fraction   Chronic kidney disease    kidney stones   Hemorrhoids    Hyperlipidemia    Hypertension    Plantar fasciitis of left foot 04/17/2016   Prostate cancer [facility])    s/p resection in april 2005    Past Surgical History:  Procedure Laterality Date   PROSTATECTOMY  2007     Current Outpatient Medications  Medication Sig Dispense Refill   aspirin 81 MG tablet Take 81 mg by mouth daily.     atorvastatin (LIPITOR) 80 MG tablet Take 80 mg by mouth daily.     Cholecalciferol (VITAMIN D3) 1000 UNITS CAPS Take 1 capsule by mouth daily.     clopidogrel (PLAVIX) 75 MG tablet Take 1 tablet (75 mg total) by mouth daily. 30 tablet 0   diphenhydramine-acetaminophen (TYLENOL PM) 25-500 MG TABS tablet Take 1 tablet by mouth at bedtime as needed (FOR PAIN/SLEEP).     famotidine (PEPCID) 20 MG tablet Take 1 tablet by mouth daily.     fish oil-omega-3 fatty acids 1000 MG capsule  Take 1 g by mouth 2 (two) times daily.     ketoconazole (NIZORAL) 2 % cream as needed.     loratadine (CLARITIN) 10 MG tablet Take 10 mg by mouth daily as needed for allergies.     losartan (COZAAR) 25 MG tablet TAKE 1 TABLET BY MOUTH EVERY DAY 90 tablet 0   metoprolol tartrate (LOPRESSOR) 25 MG tablet Take 1 tablet (25 mg total) by mouth 2 (two) times daily. 180 tablet 3   nitroGLYCERIN (NITROSTAT) 0.4 MG SL tablet DISSOLVE 1 TAB UNDER TOUNGE FOR CHEST PAIN. MAY REPEAT EVERY 5 MINUTES FOR 3 DOSES. IF NO RELIEF CALL 911 OR GO TO ER 25 tablet 2   SPIRIVA HANDIHALER 18 MCG inhalation capsule as needed.     No current facility-administered medications for this visit.    Allergies:   Patient has no known allergies.    ROS:  Please see the history of present illness.   Otherwise, review of systems are positive for mpme.   All other systems are reviewed and negative.    PHYSICAL EXAM: VS:  BP (!) 146/88   Pulse 74   Ht 5\' 9"  (1.753 m)   Wt 203 lb 6.4 oz (92.3 kg)   SpO2 94%  BMI 30.04 kg/m  , BMI Body mass index is 30.04 kg/m. GENERAL:  Well appearing NECK:  No jugular venous distention, waveform within normal limits, carotid upstroke brisk and symmetric, no bruits, no thyromegaly LUNGS:  Clear to auscultation bilaterally CHEST:  Unremarkable HEART:  PMI not displaced or sustained,S1 and S2 within normal limits, no S3, no S4, no clicks, no rubs, no murmurs ABD:  Flat, positive bowel sounds normal in frequency in pitch, no bruits, no rebound, no guarding, no midline pulsatile mass, no hepatomegaly, no splenomegaly EXT:  2 plus pulses throughout, no edema, no cyanosis no clubbing   EKG:  EKG is  ordered today. The ekg ordered today demonstrates sinus rhythm, rate 74 axis within normal limits, intervals within normal limits, old inferior infarct.   Recent Labs: No results found for requested labs within last 8760 hours.     Wt Readings from Last 3 Encounters:  05/20/21 203 lb 6.4  oz (92.3 kg)  05/16/20 209 lb (94.8 kg)  05/26/19 208 lb (94.3 kg)      Other studies Reviewed: Additional studies/ records that were reviewed today include: None Review of the above records demonstrates:  Please see elsewhere in the note.     ASSESSMENT AND PLAN:  CAD:  The patient has no new sypmtoms.  No further cardiovascular testing is indicated.  We will continue with aggressive risk reduction and meds as listed.  HTN:  The blood pressure is mildly elevated but this is unusual.  He will keep an eye on it.  He did start Cozaar last year and he thinks it was well controlled per tickly when he was walking which she has not done as much this summer.   DYSLIPIDEMIA:   I will get the results of the lipid profile that he is going to have done soon.    Current medicines are reviewed at length with the patient today.  The patient does not have concerns regarding medicines.  The following changes have been made: As above  Labs/ tests ordered today include: None  No orders of the defined types were placed in this encounter.    Disposition:   FU with me in 12 months.     Signed, Minus Breeding, MD  05/20/2021 1:33 PM    [facility]

## 2021-05-19 ENCOUNTER — Ambulatory Visit: Payer: Medicare HMO | Admitting: Cardiology

## 2021-05-20 ENCOUNTER — Encounter: Payer: Self-pay | Admitting: Cardiology

## 2021-05-20 ENCOUNTER — Other Ambulatory Visit: Payer: Self-pay

## 2021-05-20 ENCOUNTER — Ambulatory Visit: Payer: Medicare HMO | Admitting: Cardiology

## 2021-05-20 VITALS — BP 146/88 | HR 74 | Ht 69.0 in | Wt 203.4 lb

## 2021-05-20 DIAGNOSIS — E785 Hyperlipidemia, unspecified: Secondary | ICD-10-CM

## 2021-05-20 DIAGNOSIS — I2510 Atherosclerotic heart disease of native coronary artery without angina pectoris: Secondary | ICD-10-CM

## 2021-05-20 DIAGNOSIS — I10 Essential (primary) hypertension: Secondary | ICD-10-CM | POA: Diagnosis not present

## 2021-05-20 NOTE — Patient Instructions (Addendum)
Medication Instructions:  Your physician recommends that you continue on your current medications as directed. Please refer to the Current Medication list given to you today.  *If you need a refill on your cardiac medications before your next appointment, please call your pharmacy*   Lab Work: None If you have labs (blood work) drawn today and your tests are completely normal, you will receive your results only by: [place] (if you have MyChart) OR A paper copy in the mail If you have any lab test that is abnormal or we need to change your treatment, we will call you to review the results.   Testing/Procedures: None   Follow-Up: At [facility], you and your health needs are our priority.  As part of our continuing mission to provide you with exceptional heart care, we have created designated Provider Care Teams.  These Care Teams include your primary Cardiologist (physician) and Advanced Practice Providers (APPs -  Physician Assistants and Nurse Practitioners) who all work together to provide you with the care you need, when you need it.  We recommend signing up for the patient portal called "MyChart".  Sign up information is provided on this After Visit Summary.  MyChart is used to connect with patients for Virtual Visits (Telemedicine).  Patients are able to view lab/test results, encounter notes, upcoming appointments, etc.  Non-urgent messages can be sent to your provider as well.   To learn more about what you can do with MyChart, go to [email or web address].    Your next appointment:   1 year(s)  The format for your next appointment:   In Person  Provider:   Minus Breeding, MD    If MD is not listed, click here to update    :1}    Other Instructions

## 2021-06-05 DIAGNOSIS — R0602 Shortness of breath: Secondary | ICD-10-CM | POA: Diagnosis not present

## 2021-06-05 DIAGNOSIS — Z0000 Encounter for general adult medical examination without abnormal findings: Secondary | ICD-10-CM | POA: Diagnosis not present

## 2021-06-05 DIAGNOSIS — Z23 Encounter for immunization: Secondary | ICD-10-CM | POA: Diagnosis not present

## 2021-06-05 DIAGNOSIS — Z79899 Other long term (current) drug therapy: Secondary | ICD-10-CM | POA: Diagnosis not present

## 2021-06-05 DIAGNOSIS — Z20822 Contact with and (suspected) exposure to covid-19: Secondary | ICD-10-CM | POA: Diagnosis not present

## 2021-06-05 DIAGNOSIS — I252 Old myocardial infarction: Secondary | ICD-10-CM | POA: Diagnosis not present

## 2021-06-05 DIAGNOSIS — I2510 Atherosclerotic heart disease of native coronary artery without angina pectoris: Secondary | ICD-10-CM | POA: Diagnosis not present

## 2021-06-05 DIAGNOSIS — Z125 Encounter for screening for malignant neoplasm of prostate: Secondary | ICD-10-CM | POA: Diagnosis not present

## 2021-06-05 DIAGNOSIS — Z131 Encounter for screening for diabetes mellitus: Secondary | ICD-10-CM | POA: Diagnosis not present

## 2021-06-05 DIAGNOSIS — E7800 Pure hypercholesterolemia, unspecified: Secondary | ICD-10-CM | POA: Diagnosis not present

## 2021-06-05 DIAGNOSIS — E559 Vitamin D deficiency, unspecified: Secondary | ICD-10-CM | POA: Diagnosis not present

## 2021-06-26 DIAGNOSIS — Z1331 Encounter for screening for depression: Secondary | ICD-10-CM | POA: Diagnosis not present

## 2021-06-26 DIAGNOSIS — I252 Old myocardial infarction: Secondary | ICD-10-CM | POA: Diagnosis not present

## 2021-06-26 DIAGNOSIS — I10 Essential (primary) hypertension: Secondary | ICD-10-CM | POA: Diagnosis not present

## 2021-06-26 DIAGNOSIS — E7800 Pure hypercholesterolemia, unspecified: Secondary | ICD-10-CM | POA: Diagnosis not present

## 2021-06-26 DIAGNOSIS — Z1339 Encounter for screening examination for other mental health and behavioral disorders: Secondary | ICD-10-CM | POA: Diagnosis not present

## 2021-06-26 DIAGNOSIS — I2510 Atherosclerotic heart disease of native coronary artery without angina pectoris: Secondary | ICD-10-CM | POA: Diagnosis not present

## 2021-06-26 DIAGNOSIS — E559 Vitamin D deficiency, unspecified: Secondary | ICD-10-CM | POA: Diagnosis not present

## 2021-06-26 DIAGNOSIS — E119 Type 2 diabetes mellitus without complications: Secondary | ICD-10-CM | POA: Diagnosis not present

## 2021-07-17 DIAGNOSIS — R0602 Shortness of breath: Secondary | ICD-10-CM | POA: Diagnosis not present

## 2021-07-17 DIAGNOSIS — I2510 Atherosclerotic heart disease of native coronary artery without angina pectoris: Secondary | ICD-10-CM | POA: Diagnosis not present

## 2021-07-17 DIAGNOSIS — I252 Old myocardial infarction: Secondary | ICD-10-CM | POA: Diagnosis not present

## 2021-07-17 DIAGNOSIS — I10 Essential (primary) hypertension: Secondary | ICD-10-CM | POA: Diagnosis not present

## 2021-07-17 DIAGNOSIS — E119 Type 2 diabetes mellitus without complications: Secondary | ICD-10-CM | POA: Diagnosis not present

## 2021-07-17 DIAGNOSIS — E7800 Pure hypercholesterolemia, unspecified: Secondary | ICD-10-CM | POA: Diagnosis not present

## 2021-07-23 DIAGNOSIS — R42 Dizziness and giddiness: Secondary | ICD-10-CM | POA: Diagnosis not present

## 2021-07-29 DIAGNOSIS — E7800 Pure hypercholesterolemia, unspecified: Secondary | ICD-10-CM | POA: Diagnosis not present

## 2021-07-29 DIAGNOSIS — I10 Essential (primary) hypertension: Secondary | ICD-10-CM | POA: Diagnosis not present

## 2021-07-29 DIAGNOSIS — E559 Vitamin D deficiency, unspecified: Secondary | ICD-10-CM | POA: Diagnosis not present

## 2021-07-29 DIAGNOSIS — E119 Type 2 diabetes mellitus without complications: Secondary | ICD-10-CM | POA: Diagnosis not present

## 2021-07-29 DIAGNOSIS — I252 Old myocardial infarction: Secondary | ICD-10-CM | POA: Diagnosis not present

## 2021-07-29 DIAGNOSIS — I2510 Atherosclerotic heart disease of native coronary artery without angina pectoris: Secondary | ICD-10-CM | POA: Diagnosis not present

## 2021-08-06 ENCOUNTER — Other Ambulatory Visit: Payer: Self-pay | Admitting: Cardiology

## 2021-08-06 DIAGNOSIS — Z87442 Personal history of urinary calculi: Secondary | ICD-10-CM | POA: Diagnosis not present

## 2021-08-06 DIAGNOSIS — N499 Inflammatory disorder of unspecified male genital organ: Secondary | ICD-10-CM | POA: Diagnosis not present

## 2021-08-06 DIAGNOSIS — Z8546 Personal history of malignant neoplasm of prostate: Secondary | ICD-10-CM | POA: Diagnosis not present

## 2021-08-20 DIAGNOSIS — Z6830 Body mass index (BMI) 30.0-30.9, adult: Secondary | ICD-10-CM | POA: Diagnosis not present

## 2021-08-20 DIAGNOSIS — G930 Cerebral cysts: Secondary | ICD-10-CM | POA: Diagnosis not present

## 2021-08-20 DIAGNOSIS — I10 Essential (primary) hypertension: Secondary | ICD-10-CM | POA: Diagnosis not present

## 2021-08-26 DIAGNOSIS — E559 Vitamin D deficiency, unspecified: Secondary | ICD-10-CM | POA: Diagnosis not present

## 2021-08-26 DIAGNOSIS — E119 Type 2 diabetes mellitus without complications: Secondary | ICD-10-CM | POA: Diagnosis not present

## 2021-08-26 DIAGNOSIS — I252 Old myocardial infarction: Secondary | ICD-10-CM | POA: Diagnosis not present

## 2021-08-26 DIAGNOSIS — Z0000 Encounter for general adult medical examination without abnormal findings: Secondary | ICD-10-CM | POA: Diagnosis not present

## 2021-08-26 DIAGNOSIS — E7800 Pure hypercholesterolemia, unspecified: Secondary | ICD-10-CM | POA: Diagnosis not present

## 2021-08-26 DIAGNOSIS — I10 Essential (primary) hypertension: Secondary | ICD-10-CM | POA: Diagnosis not present

## 2021-08-26 DIAGNOSIS — I2510 Atherosclerotic heart disease of native coronary artery without angina pectoris: Secondary | ICD-10-CM | POA: Diagnosis not present

## 2021-09-26 DIAGNOSIS — G8929 Other chronic pain: Secondary | ICD-10-CM | POA: Diagnosis not present

## 2021-09-26 DIAGNOSIS — E7800 Pure hypercholesterolemia, unspecified: Secondary | ICD-10-CM | POA: Diagnosis not present

## 2021-09-26 DIAGNOSIS — I2510 Atherosclerotic heart disease of native coronary artery without angina pectoris: Secondary | ICD-10-CM | POA: Diagnosis not present

## 2021-09-26 DIAGNOSIS — M25561 Pain in right knee: Secondary | ICD-10-CM | POA: Diagnosis not present

## 2021-09-26 DIAGNOSIS — E119 Type 2 diabetes mellitus without complications: Secondary | ICD-10-CM | POA: Diagnosis not present

## 2021-09-26 DIAGNOSIS — I10 Essential (primary) hypertension: Secondary | ICD-10-CM | POA: Diagnosis not present

## 2021-09-26 DIAGNOSIS — M1710 Unilateral primary osteoarthritis, unspecified knee: Secondary | ICD-10-CM | POA: Diagnosis not present

## 2021-09-26 DIAGNOSIS — E559 Vitamin D deficiency, unspecified: Secondary | ICD-10-CM | POA: Diagnosis not present

## 2021-09-26 DIAGNOSIS — Z79899 Other long term (current) drug therapy: Secondary | ICD-10-CM | POA: Diagnosis not present

## 2021-09-26 DIAGNOSIS — I252 Old myocardial infarction: Secondary | ICD-10-CM | POA: Diagnosis not present

## 2021-10-27 DIAGNOSIS — E7800 Pure hypercholesterolemia, unspecified: Secondary | ICD-10-CM | POA: Diagnosis not present

## 2021-10-27 DIAGNOSIS — I10 Essential (primary) hypertension: Secondary | ICD-10-CM | POA: Diagnosis not present

## 2021-10-27 DIAGNOSIS — E119 Type 2 diabetes mellitus without complications: Secondary | ICD-10-CM | POA: Diagnosis not present

## 2021-10-27 DIAGNOSIS — I252 Old myocardial infarction: Secondary | ICD-10-CM | POA: Diagnosis not present

## 2021-10-27 DIAGNOSIS — I2510 Atherosclerotic heart disease of native coronary artery without angina pectoris: Secondary | ICD-10-CM | POA: Diagnosis not present

## 2021-11-03 ENCOUNTER — Other Ambulatory Visit: Payer: Self-pay | Admitting: Cardiology

## 2021-11-21 DIAGNOSIS — I252 Old myocardial infarction: Secondary | ICD-10-CM | POA: Diagnosis not present

## 2021-11-21 DIAGNOSIS — I2510 Atherosclerotic heart disease of native coronary artery without angina pectoris: Secondary | ICD-10-CM | POA: Diagnosis not present

## 2021-11-21 DIAGNOSIS — E119 Type 2 diabetes mellitus without complications: Secondary | ICD-10-CM | POA: Diagnosis not present

## 2021-11-21 DIAGNOSIS — E7800 Pure hypercholesterolemia, unspecified: Secondary | ICD-10-CM | POA: Diagnosis not present

## 2021-11-21 DIAGNOSIS — I10 Essential (primary) hypertension: Secondary | ICD-10-CM | POA: Diagnosis not present

## 2021-11-21 DIAGNOSIS — J40 Bronchitis, not specified as acute or chronic: Secondary | ICD-10-CM | POA: Diagnosis not present

## 2022-01-12 ENCOUNTER — Other Ambulatory Visit: Payer: Self-pay | Admitting: *Deleted

## 2022-01-12 NOTE — Patient Outreach (Signed)
  Care Coordination   01/12/2022 Name: Nicholas [name] MRN: [id number] DOB: 1940-12-24   Care Coordination Outreach Attempts:  An unsuccessful telephone outreach was attempted today to offer the patient information about available care coordination services as a benefit of their health plan.   Follow Up Plan:  Additional outreach attempts will be made to offer the patient care coordination information and services.   Encounter Outcome:  No Answer  Care Coordination Interventions Activated:  No   Care Coordination Interventions:  No, not indicated    Raina Mina, RN Care Management Coordinator [place] Office [phone]

## 2022-02-01 ENCOUNTER — Other Ambulatory Visit: Payer: Self-pay | Admitting: Cardiology

## 2022-03-09 DIAGNOSIS — E119 Type 2 diabetes mellitus without complications: Secondary | ICD-10-CM | POA: Diagnosis not present

## 2022-03-09 DIAGNOSIS — I10 Essential (primary) hypertension: Secondary | ICD-10-CM | POA: Diagnosis not present

## 2022-03-09 DIAGNOSIS — G8929 Other chronic pain: Secondary | ICD-10-CM | POA: Diagnosis not present

## 2022-03-09 DIAGNOSIS — M1710 Unilateral primary osteoarthritis, unspecified knee: Secondary | ICD-10-CM | POA: Diagnosis not present

## 2022-03-09 DIAGNOSIS — Z79899 Other long term (current) drug therapy: Secondary | ICD-10-CM | POA: Diagnosis not present

## 2022-03-09 DIAGNOSIS — E7800 Pure hypercholesterolemia, unspecified: Secondary | ICD-10-CM | POA: Diagnosis not present

## 2022-03-09 DIAGNOSIS — E559 Vitamin D deficiency, unspecified: Secondary | ICD-10-CM | POA: Diagnosis not present

## 2022-03-09 DIAGNOSIS — M25561 Pain in right knee: Secondary | ICD-10-CM | POA: Diagnosis not present

## 2022-03-09 DIAGNOSIS — D367 Benign neoplasm of other specified sites: Secondary | ICD-10-CM | POA: Diagnosis not present

## 2022-03-09 DIAGNOSIS — I252 Old myocardial infarction: Secondary | ICD-10-CM | POA: Diagnosis not present

## 2022-03-09 DIAGNOSIS — I2510 Atherosclerotic heart disease of native coronary artery without angina pectoris: Secondary | ICD-10-CM | POA: Diagnosis not present

## 2022-03-09 DIAGNOSIS — N5089 Other specified disorders of the male genital organs: Secondary | ICD-10-CM | POA: Diagnosis not present

## 2022-03-09 DIAGNOSIS — G930 Cerebral cysts: Secondary | ICD-10-CM | POA: Diagnosis not present

## 2022-03-11 ENCOUNTER — Telehealth: Payer: Self-pay | Admitting: *Deleted

## 2022-03-11 NOTE — Patient Outreach (Signed)
  Care Coordination   Initial Visit Note   03/11/2022 Name: CORDON GASSETT MRN: [id number] DOB: 1941/03/11  ZACARIAH BELUE is a 81 y.o. year old male who sees Pcp, No for primary care. I spoke with  Augustine Radar by phone today.  What matters to the patients health and wellness today?  Change Provider    Goals Addressed               This Visit's Progress     COMPLETED: [place] name of PCP to [facility] (pt-stated)        Care Coordination Interventions: Collaborated with CMA regarding system change of PCP for Epic system          SDOH assessments and interventions completed:  No     Care Coordination Interventions Activated:  Yes  Care Coordination Interventions:  Yes, provided   Follow up plan: No further intervention required.   Encounter Outcome:  Pt. Visit Completed    Raina Mina, RN Care Management Coordinator [place] Office [phone]

## 2022-03-11 NOTE — Patient Instructions (Signed)
Visit Information  Thank you for taking time to visit with me today. Please don't hesitate to contact me if I can be of assistance to you.   Following are the goals we discussed today:   Goals Addressed               This Visit's Progress     COMPLETED: [place] name of PCP to [facility] (pt-stated)        Care Coordination Interventions: Collaborated with CMA regarding system change of PCP for Epic system          Please call the care guide team at [phone] if you need to cancel or reschedule your appointment.   If you are experiencing a Mental Health or [place] or need someone to talk to, please call the Suicide and Crisis Lifeline: 988  The patient verbalized understanding of instructions, educational materials, and care plan provided today and DECLINED offer to receive copy of patient instructions, educational materials, and care plan.   No further follow up required: No additional needs  Raina Mina, RN Care Management Coordinator [place] Office [phone]

## 2022-03-12 DIAGNOSIS — J069 Acute upper respiratory infection, unspecified: Secondary | ICD-10-CM | POA: Diagnosis not present

## 2022-03-12 DIAGNOSIS — Z8546 Personal history of malignant neoplasm of prostate: Secondary | ICD-10-CM | POA: Diagnosis not present

## 2022-03-12 DIAGNOSIS — E119 Type 2 diabetes mellitus without complications: Secondary | ICD-10-CM | POA: Diagnosis not present

## 2022-03-12 DIAGNOSIS — I2510 Atherosclerotic heart disease of native coronary artery without angina pectoris: Secondary | ICD-10-CM | POA: Diagnosis not present

## 2022-03-12 DIAGNOSIS — I10 Essential (primary) hypertension: Secondary | ICD-10-CM | POA: Diagnosis not present

## 2022-03-12 DIAGNOSIS — R059 Cough, unspecified: Secondary | ICD-10-CM | POA: Diagnosis not present

## 2022-03-12 DIAGNOSIS — N182 Chronic kidney disease, stage 2 (mild): Secondary | ICD-10-CM | POA: Diagnosis not present

## 2022-03-12 DIAGNOSIS — E7800 Pure hypercholesterolemia, unspecified: Secondary | ICD-10-CM | POA: Diagnosis not present

## 2022-04-08 DIAGNOSIS — G8929 Other chronic pain: Secondary | ICD-10-CM | POA: Diagnosis not present

## 2022-04-08 DIAGNOSIS — Z125 Encounter for screening for malignant neoplasm of prostate: Secondary | ICD-10-CM | POA: Diagnosis not present

## 2022-04-08 DIAGNOSIS — E119 Type 2 diabetes mellitus without complications: Secondary | ICD-10-CM | POA: Diagnosis not present

## 2022-04-08 DIAGNOSIS — E7800 Pure hypercholesterolemia, unspecified: Secondary | ICD-10-CM | POA: Diagnosis not present

## 2022-04-08 DIAGNOSIS — M1710 Unilateral primary osteoarthritis, unspecified knee: Secondary | ICD-10-CM | POA: Diagnosis not present

## 2022-04-08 DIAGNOSIS — I2510 Atherosclerotic heart disease of native coronary artery without angina pectoris: Secondary | ICD-10-CM | POA: Diagnosis not present

## 2022-04-08 DIAGNOSIS — N5089 Other specified disorders of the male genital organs: Secondary | ICD-10-CM | POA: Diagnosis not present

## 2022-04-08 DIAGNOSIS — I10 Essential (primary) hypertension: Secondary | ICD-10-CM | POA: Diagnosis not present

## 2022-04-08 DIAGNOSIS — I252 Old myocardial infarction: Secondary | ICD-10-CM | POA: Diagnosis not present

## 2022-04-08 DIAGNOSIS — G930 Cerebral cysts: Secondary | ICD-10-CM | POA: Diagnosis not present

## 2022-04-08 DIAGNOSIS — M25561 Pain in right knee: Secondary | ICD-10-CM | POA: Diagnosis not present

## 2022-04-08 DIAGNOSIS — Z23 Encounter for immunization: Secondary | ICD-10-CM | POA: Diagnosis not present

## 2022-05-06 DIAGNOSIS — I2510 Atherosclerotic heart disease of native coronary artery without angina pectoris: Secondary | ICD-10-CM | POA: Diagnosis not present

## 2022-05-06 DIAGNOSIS — G8929 Other chronic pain: Secondary | ICD-10-CM | POA: Diagnosis not present

## 2022-05-06 DIAGNOSIS — K5909 Other constipation: Secondary | ICD-10-CM | POA: Diagnosis not present

## 2022-05-06 DIAGNOSIS — G930 Cerebral cysts: Secondary | ICD-10-CM | POA: Diagnosis not present

## 2022-05-06 DIAGNOSIS — E7800 Pure hypercholesterolemia, unspecified: Secondary | ICD-10-CM | POA: Diagnosis not present

## 2022-05-06 DIAGNOSIS — M25561 Pain in right knee: Secondary | ICD-10-CM | POA: Diagnosis not present

## 2022-05-06 DIAGNOSIS — J42 Unspecified chronic bronchitis: Secondary | ICD-10-CM | POA: Diagnosis not present

## 2022-05-06 DIAGNOSIS — I10 Essential (primary) hypertension: Secondary | ICD-10-CM | POA: Diagnosis not present

## 2022-05-06 DIAGNOSIS — N5089 Other specified disorders of the male genital organs: Secondary | ICD-10-CM | POA: Diagnosis not present

## 2022-05-06 DIAGNOSIS — E119 Type 2 diabetes mellitus without complications: Secondary | ICD-10-CM | POA: Diagnosis not present

## 2022-05-06 DIAGNOSIS — I252 Old myocardial infarction: Secondary | ICD-10-CM | POA: Diagnosis not present

## 2022-05-06 DIAGNOSIS — M1710 Unilateral primary osteoarthritis, unspecified knee: Secondary | ICD-10-CM | POA: Diagnosis not present

## 2022-05-07 ENCOUNTER — Other Ambulatory Visit: Payer: Self-pay | Admitting: Cardiology

## 2022-06-09 DIAGNOSIS — E119 Type 2 diabetes mellitus without complications: Secondary | ICD-10-CM | POA: Diagnosis not present

## 2022-06-09 DIAGNOSIS — I10 Essential (primary) hypertension: Secondary | ICD-10-CM | POA: Diagnosis not present

## 2022-06-09 DIAGNOSIS — G930 Cerebral cysts: Secondary | ICD-10-CM | POA: Diagnosis not present

## 2022-06-09 DIAGNOSIS — M25561 Pain in right knee: Secondary | ICD-10-CM | POA: Diagnosis not present

## 2022-06-09 DIAGNOSIS — G8929 Other chronic pain: Secondary | ICD-10-CM | POA: Diagnosis not present

## 2022-06-09 DIAGNOSIS — Z6828 Body mass index (BMI) 28.0-28.9, adult: Secondary | ICD-10-CM | POA: Diagnosis not present

## 2022-06-09 DIAGNOSIS — I2510 Atherosclerotic heart disease of native coronary artery without angina pectoris: Secondary | ICD-10-CM | POA: Diagnosis not present

## 2022-06-09 DIAGNOSIS — E7800 Pure hypercholesterolemia, unspecified: Secondary | ICD-10-CM | POA: Diagnosis not present

## 2022-06-09 DIAGNOSIS — I252 Old myocardial infarction: Secondary | ICD-10-CM | POA: Diagnosis not present

## 2022-06-09 DIAGNOSIS — J42 Unspecified chronic bronchitis: Secondary | ICD-10-CM | POA: Diagnosis not present

## 2022-06-09 DIAGNOSIS — M1710 Unilateral primary osteoarthritis, unspecified knee: Secondary | ICD-10-CM | POA: Diagnosis not present

## 2022-06-09 DIAGNOSIS — N5089 Other specified disorders of the male genital organs: Secondary | ICD-10-CM | POA: Diagnosis not present

## 2022-06-16 NOTE — Progress Notes (Unsigned)
Cardiology Office Note   Date:  06/16/2022   ID:  Nicholas [name], DOB 1941-04-29, MRN [id number]  PCP:  Valinda Hoar, PA-C  Cardiologist:   None Referring:  Mayo, Darla Lesches, PA-C  No chief complaint on file.      History of Present Illness: Nicholas [name] is a 82 y.o. male who presents for follow up of CAD.   Since I last saw him ***  ***  he has done very well.  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  He denies any chest pressure, discomfort.  He has no palpitations, presyncope or syncope.  He has no shortness of breath, PND or orthopnea.  He has had no weight gain or edema.   Past Medical History:  Diagnosis Date   CAD (coronary artery disease)    2009 inferior myocardial infarction. Previous stenting of his right coronary artery. Subsequently, he had stent thrombosis in the right coronary stent and failed to keep that vessel open even after restenting. He also had LAD 50-60% stenosis, mid to proximal, and OM1 40% stenosis. Well-preserved ejection fraction   Chronic kidney disease    kidney stones   Hemorrhoids    Hyperlipidemia    Hypertension    Plantar fasciitis of left foot 04/17/2016   Prostate cancer [facility])    s/p resection in april 2005    Past Surgical History:  Procedure Laterality Date   PROSTATECTOMY  2007     Current Outpatient Medications  Medication Sig Dispense Refill   aspirin 81 MG tablet Take 81 mg by mouth daily.     atorvastatin (LIPITOR) 80 MG tablet Take 80 mg by mouth daily.     Cholecalciferol (VITAMIN D3) 1000 UNITS CAPS Take 1 capsule by mouth daily.     clopidogrel (PLAVIX) 75 MG tablet Take 1 tablet (75 mg total) by mouth daily. 30 tablet 0   diphenhydramine-acetaminophen (TYLENOL PM) 25-500 MG TABS tablet Take 1 tablet by mouth at bedtime as needed (FOR PAIN/SLEEP).     famotidine (PEPCID) 20 MG tablet Take 1 tablet by mouth daily.     fish oil-omega-3 fatty acids  1000 MG capsule Take 1 g by mouth 2 (two) times daily.     ketoconazole (NIZORAL) 2 % cream as needed.     loratadine (CLARITIN) 10 MG tablet Take 10 mg by mouth daily as needed for allergies.     losartan (COZAAR) 25 MG tablet TAKE 1 TABLET BY MOUTH EVERY DAY 90 tablet 0   metoprolol tartrate (LOPRESSOR) 25 MG tablet Take 1 tablet (25 mg total) by mouth 2 (two) times daily. 180 tablet 3   nitroGLYCERIN (NITROSTAT) 0.4 MG SL tablet DISSOLVE 1 TAB UNDER TOUNGE FOR CHEST PAIN. MAY REPEAT EVERY 5 MINUTES FOR 3 DOSES. IF NO RELIEF CALL 911 OR GO TO ER 25 tablet 2   SPIRIVA HANDIHALER 18 MCG inhalation capsule as needed.     No current facility-administered medications for this visit.    Allergies:   Patient has no known allergies.    ROS:  Please see the history of present illness.   Otherwise, review of systems are positive for ***.   All other systems are reviewed and negative.    PHYSICAL EXAM: VS:  There were no vitals taken for this visit. , BMI There is no height or weight on file to calculate BMI. GENERAL:  Well appearing  NECK:  No jugular venous distention, waveform within normal limits, carotid upstroke brisk and symmetric, no bruits, no thyromegaly LUNGS:  Clear to auscultation bilaterally CHEST:  Unremarkable HEART:  PMI not displaced or sustained,S1 and S2 within normal limits, no S3, no S4, no clicks, no rubs, *** murmurs ABD:  Flat, positive bowel sounds normal in frequency in pitch, no bruits, no rebound, no guarding, no midline pulsatile mass, no hepatomegaly, no splenomegaly EXT:  2 plus pulses throughout, no edema, no cyanosis no clubbing    ***GENERAL:  Well appearing NECK:  No jugular venous distention, waveform within normal limits, carotid upstroke brisk and symmetric, no bruits, no thyromegaly LUNGS:  Clear to auscultation bilaterally CHEST:  Unremarkable HEART:  PMI not displaced or sustained,S1 and S2 within normal limits, no S3, no S4, no clicks, no rubs, no  murmurs ABD:  Flat, positive bowel sounds normal in frequency in pitch, no bruits, no rebound, no guarding, no midline pulsatile mass, no hepatomegaly, no splenomegaly EXT:  2 plus pulses throughout, no edema, no cyanosis no clubbing   EKG:  EKG is ***ordered today. The ekg ordered today demonstrates sinus rhythm, rate *** axis within normal limits, intervals within normal limits, old inferior infarct.   Recent Labs: No results found for requested labs within last 365 days.     Wt Readings from Last 3 Encounters:  05/20/21 203 lb 6.4 oz (92.3 kg)  05/16/20 209 lb (94.8 kg)  05/26/19 208 lb (94.3 kg)      Other studies Reviewed: Additional studies/ records that were reviewed today include: *** Review of the above records demonstrates:  Please see elsewhere in the note.     ASSESSMENT AND PLAN:  CAD:  ***  The patient has no new sypmtoms.  No further cardiovascular testing is indicated.  We will continue with aggressive risk reduction and meds as listed.  HTN:  The blood pressure is ***  mildly elevated but this is unusual.  He will keep an eye on it.  He did start Cozaar last year and he thinks it was well controlled per tickly when he was walking which she has not done as much this summer.   DYSLIPIDEMIA:   ***  I will get the results of the lipid profile that he is going to have done soon.    Current medicines are reviewed at length with the patient today.  The patient does not have concerns regarding medicines.  The following changes have been made:  ***  Labs/ tests ordered today include: ***  No orders of the defined types were placed in this encounter.     Disposition:   FU with me in *** months.     Signed, Minus Breeding, MD  06/16/2022 10:00 PM    [place]

## 2022-06-18 ENCOUNTER — Encounter: Payer: Self-pay | Admitting: Cardiology

## 2022-06-18 ENCOUNTER — Ambulatory Visit: Payer: Medicare HMO | Attending: Cardiology | Admitting: Cardiology

## 2022-06-18 VITALS — BP 130/82 | HR 60 | Ht 69.0 in | Wt 197.4 lb

## 2022-06-18 DIAGNOSIS — I2510 Atherosclerotic heart disease of native coronary artery without angina pectoris: Secondary | ICD-10-CM

## 2022-06-18 DIAGNOSIS — I10 Essential (primary) hypertension: Secondary | ICD-10-CM | POA: Diagnosis not present

## 2022-06-18 DIAGNOSIS — E785 Hyperlipidemia, unspecified: Secondary | ICD-10-CM

## 2022-06-18 NOTE — Patient Instructions (Signed)
    Follow-Up: At [facility], you and your health needs are our priority.  As part of our continuing mission to provide you with exceptional heart care, we have created designated Provider Care Teams.  These Care Teams include your primary Cardiologist (physician) and Advanced Practice Providers (APPs -  Physician Assistants and Nurse Practitioners) who all work together to provide you with the care you need, when you need it.  We recommend signing up for the patient portal called "MyChart".  Sign up information is provided on this After Visit Summary.  MyChart is used to connect with patients for Virtual Visits (Telemedicine).  Patients are able to view lab/test results, encounter notes, upcoming appointments, etc.  Non-urgent messages can be sent to your provider as well.   To learn more about what you can do with MyChart, go to [email or web address].    Your next appointment:   12 month(s)  Provider:   JAMES HOCHREIN MD    

## 2022-07-17 DIAGNOSIS — N5089 Other specified disorders of the male genital organs: Secondary | ICD-10-CM | POA: Diagnosis not present

## 2022-07-17 DIAGNOSIS — Z79899 Other long term (current) drug therapy: Secondary | ICD-10-CM | POA: Diagnosis not present

## 2022-07-17 DIAGNOSIS — Z6828 Body mass index (BMI) 28.0-28.9, adult: Secondary | ICD-10-CM | POA: Diagnosis not present

## 2022-07-17 DIAGNOSIS — E119 Type 2 diabetes mellitus without complications: Secondary | ICD-10-CM | POA: Diagnosis not present

## 2022-07-17 DIAGNOSIS — Z0000 Encounter for general adult medical examination without abnormal findings: Secondary | ICD-10-CM | POA: Diagnosis not present

## 2022-07-17 DIAGNOSIS — M25561 Pain in right knee: Secondary | ICD-10-CM | POA: Diagnosis not present

## 2022-07-17 DIAGNOSIS — E7800 Pure hypercholesterolemia, unspecified: Secondary | ICD-10-CM | POA: Diagnosis not present

## 2022-07-17 DIAGNOSIS — I252 Old myocardial infarction: Secondary | ICD-10-CM | POA: Diagnosis not present

## 2022-07-17 DIAGNOSIS — I10 Essential (primary) hypertension: Secondary | ICD-10-CM | POA: Diagnosis not present

## 2022-07-17 DIAGNOSIS — G8929 Other chronic pain: Secondary | ICD-10-CM | POA: Diagnosis not present

## 2022-07-17 DIAGNOSIS — I2510 Atherosclerotic heart disease of native coronary artery without angina pectoris: Secondary | ICD-10-CM | POA: Diagnosis not present

## 2022-07-17 DIAGNOSIS — G930 Cerebral cysts: Secondary | ICD-10-CM | POA: Diagnosis not present

## 2022-08-03 ENCOUNTER — Other Ambulatory Visit: Payer: Self-pay | Admitting: Cardiology

## 2022-08-19 DIAGNOSIS — I2510 Atherosclerotic heart disease of native coronary artery without angina pectoris: Secondary | ICD-10-CM | POA: Diagnosis not present

## 2022-08-19 DIAGNOSIS — I10 Essential (primary) hypertension: Secondary | ICD-10-CM | POA: Diagnosis not present

## 2022-08-19 DIAGNOSIS — K5909 Other constipation: Secondary | ICD-10-CM | POA: Diagnosis not present

## 2022-08-19 DIAGNOSIS — N5089 Other specified disorders of the male genital organs: Secondary | ICD-10-CM | POA: Diagnosis not present

## 2022-08-19 DIAGNOSIS — E7800 Pure hypercholesterolemia, unspecified: Secondary | ICD-10-CM | POA: Diagnosis not present

## 2022-08-19 DIAGNOSIS — E119 Type 2 diabetes mellitus without complications: Secondary | ICD-10-CM | POA: Diagnosis not present

## 2022-08-19 DIAGNOSIS — M25561 Pain in right knee: Secondary | ICD-10-CM | POA: Diagnosis not present

## 2022-08-19 DIAGNOSIS — Z6828 Body mass index (BMI) 28.0-28.9, adult: Secondary | ICD-10-CM | POA: Diagnosis not present

## 2022-08-19 DIAGNOSIS — G8929 Other chronic pain: Secondary | ICD-10-CM | POA: Diagnosis not present

## 2022-08-19 DIAGNOSIS — G930 Cerebral cysts: Secondary | ICD-10-CM | POA: Diagnosis not present

## 2022-08-19 DIAGNOSIS — M1710 Unilateral primary osteoarthritis, unspecified knee: Secondary | ICD-10-CM | POA: Diagnosis not present

## 2022-08-19 DIAGNOSIS — I252 Old myocardial infarction: Secondary | ICD-10-CM | POA: Diagnosis not present

## 2022-12-02 ENCOUNTER — Telehealth: Payer: Self-pay | Admitting: Cardiology

## 2022-12-02 MED ORDER — NITROGLYCERIN 0.4 MG SL SUBL: SUBLINGUAL_TABLET | SUBLINGUAL | 2 refills | Status: AC

## 2022-12-02 NOTE — Telephone Encounter (Signed)
*  STAT* If patient is at the pharmacy, call can be transferred to refill team.   1. Which medications need to be refilled? (please list name of each medication and dose if known) nitroGLYCERIN (NITROSTAT) 0.4 MG SL tablet   2. Which pharmacy/location (including street and city if local pharmacy) is medication to be sent to? [id number] [place], [place] - [address]   3. Do they need a 30 day or 90 day supply? 90   PATIENT IS CURRENTLY OUT OF MEDICATION

## 2022-12-02 NOTE — Telephone Encounter (Signed)
Called patient and patient verbalized via phone understanding of desired medication and pharmacy

## 2023-01-14 NOTE — Progress Notes (Signed)
Cardiology Clinic Note   Date: 01/15/2023 ID: JAEMS GERSTENBERGER, DOB 1940/11/26, MRN [id number]  Primary Cardiologist:  Rollene Rotunda, MD  Patient Profile    Nicholas [name] is a 82 y.o. male who presents to the clinic today for routine follow up.     Past medical history significant for: CAD. LHC 09/19/2004 (STEMI): PCI with stent to distal RCA.  Noncritical disease in LAD and LCx. LHC 10/31/2005 (STEMI): Three-vessel CAD.  Acute stent thrombosis distal RCA (discontinued Plavix 1 month prior).  PCI with Cutting Balloon angioplasty for stent thrombosis distal RCA. LHC 09/27/2007 (STEMI): RCA stent thrombosis.  Moderate diffuse LAD stenosis.  Nonobstructive LCx disease.  PCI with BMS to distal RCA. LHC 09/28/2007: Acute reocclusion of RCA 24 hours post PCI.  CTS consult. LHC 09/29/2007 (staged PCI): Unable to revascularize RCA. Hypertension. Hyperlipidemia.     History of Present Illness    [name] is a longtime patient of cardiology.  He is followed by Dr. [name] for the above outlined history.  Patient was last seen in the office by Dr. [name] on 06/18/2022 for routine follow-up.  He was doing well at that time and no changes were made.  Today, patient is accompanied by his wife. He is doing well. Patient denies shortness of breath or dyspnea on exertion. No chest pain, pressure, or tightness. Denies lower extremity edema, orthopnea, or PND. No palpitations. 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     ROS: All other systems reviewed and are otherwise negative except as noted in History of Present Illness.  Studies Reviewed    EKG Interpretation Date/Time:  Friday January 15 2023 15:05:22 EDT Ventricular Rate:  69 PR Interval:  178 QRS Duration:  110 QT Interval:  420 QTC Calculation: 450 R Axis:   -3  Text Interpretation: Normal sinus rhythm Minimal voltage criteria for LVH, may be normal variant ( R in aVL ) Inferior infarct (cited on or  before 19-Sep-2004) Cannot rule out Anterior infarct (cited on or before 02-Oct-2007) When compared with ECG of 08-Mar-2015 09:06, No significant change was found Confirmed by Carlos Levering [id number]) on 01/15/2023 3:17:45 PM           Physical Exam    VS:  BP 136/84   Pulse 66   Ht 5\' 9"  (1.753 m)   Wt 192 lb 3.2 oz (87.2 kg)   SpO2 96%   BMI 28.38 kg/m  , BMI Body mass index is 28.38 kg/m.  GEN: Well nourished, well developed, in no acute distress. Neck: No JVD or carotid bruits. Cardiac:  RRR. No murmurs. No rubs or gallops.   Respiratory:  Respirations regular and unlabored. Clear to auscultation without rales, wheezing or rhonchi. GI: Soft, nontender, nondistended. Extremities: Radials/DP/PT 2+ and equal bilaterally. No clubbing or cyanosis. No edema.  Skin: Warm and dry, no rash. Neuro: Strength intact.  Assessment & Plan    CAD.  PCI with stent to distal RCA April 2006, in-stent restenosis with Cutting Balloon angioplasty May 2007, in-stent restenosis with BMS placement April 2009 followed by in-stent restenosis 24 hours later, unable to revascularize. Patient denies chest pain, pressure, or tightness. 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 Continue aspirin, Plavix, atorvastatin, metoprolol, as needed SL NTG. Hypertension: BP today 136/84. Patient denies headaches, dizziness or vision changes. Continue metoprolol, losartan. Hyperlipidemia. Cholesterol is checked by PCP. Continue atorvastatin. Will request latest labs from [facility].   Disposition: Return in 1 year or sooner as needed.          Signed, Etta Grandchild. , DNP, NP-C

## 2023-01-15 ENCOUNTER — Ambulatory Visit: Payer: Medicare HMO | Attending: Student | Admitting: Student

## 2023-01-15 ENCOUNTER — Encounter: Payer: Self-pay | Admitting: Student

## 2023-01-15 VITALS — BP 136/84 | HR 66 | Ht 69.0 in | Wt 192.2 lb

## 2023-01-15 DIAGNOSIS — E785 Hyperlipidemia, unspecified: Secondary | ICD-10-CM | POA: Diagnosis not present

## 2023-01-15 DIAGNOSIS — I10 Essential (primary) hypertension: Secondary | ICD-10-CM | POA: Diagnosis not present

## 2023-01-15 DIAGNOSIS — I2510 Atherosclerotic heart disease of native coronary artery without angina pectoris: Secondary | ICD-10-CM | POA: Diagnosis not present

## 2023-01-15 NOTE — Patient Instructions (Signed)
Medication Instructions:  No changes *If you need a refill on your cardiac medications before your next appointment, please call your pharmacy*   Lab Work: none If you have labs (blood work) drawn today and your tests are completely normal, you will receive your results only by: MyChart Message (if you have MyChart) OR A paper copy in the mail If you have any lab test that is abnormal or we need to change your treatment, we will call you to review the results.   Testing/Procedures: none   Follow-Up: At [facility], you and your health needs are our priority.  As part of our continuing mission to provide you with exceptional heart care, we have created designated Provider Care Teams.  These Care Teams include your primary Cardiologist (physician) and Advanced Practice Providers (APPs -  Physician Assistants and Nurse Practitioners) who all work together to provide you with the care you need, when you need it.  We recommend signing up for the patient portal called "MyChart".  Sign up information is provided on this After Visit Summary.  MyChart is used to connect with patients for Virtual Visits (Telemedicine).  Patients are able to view lab/test results, encounter notes, upcoming appointments, etc.  Non-urgent messages can be sent to your provider as well.   To learn more about what you can do with MyChart, go to [email or web address].    Your next appointment:   1 year(s)  Provider:   Rollene Rotunda, MD

## 2023-07-15 ENCOUNTER — Other Ambulatory Visit: Payer: Self-pay | Admitting: Cardiology

## 2023-09-13 ENCOUNTER — Telehealth: Payer: Self-pay | Admitting: *Deleted

## 2023-09-13 NOTE — Telephone Encounter (Signed)
   Pre-operative Risk Assessment    Patient Name: Nicholas [name]  DOB: 05/05/1941 MRN: [id number]   Date of last office visit: 01/15/23 Carlos Levering, NP Date of next office visit: NONE  Request for Surgical Clearance    Procedure:  Dental Extraction - Amount of Teeth to be Pulled:  7 TEETH EXTRACTED WITH 2 OF THE EXTRACTIONS PREDICTED TO BE SURGICAL EXTRACTIONS  Date of Surgery:  Clearance TBD                                Surgeon:  DR. DANIEL TATARSKI Surgeon's Group or Practice Name:  [facility]  Phone number:  [phone] Fax number:  [phone]   Type of Clearance Requested:   - Medical  - Pharmacy:  Hold Aspirin and Clopidogrel (Plavix)     Type of Anesthesia:  Not Indicated (LOCAL?)   Additional requests/questions:    Elpidio Anis   09/13/2023, 1:18 PM

## 2023-09-13 NOTE — Telephone Encounter (Signed)
 Dr. [name],   Patient has an extensive history of PCI with in-stent restenosis.  His PCI history: LHC 09/19/2004 (STEMI): PCI with stent to distal RCA.  Noncritical disease in LAD and LCx. LHC 10/31/2005 (STEMI): Three-vessel CAD.  Acute stent thrombosis distal RCA (discontinued Plavix 1 month prior).  PCI with Cutting Balloon angioplasty for stent thrombosis distal RCA. LHC 09/27/2007 (STEMI): RCA stent thrombosis.  Moderate diffuse LAD stenosis.  Nonobstructive LCx disease.  PCI with BMS to distal RCA. LHC 09/28/2007: Acute reocclusion of RCA 24 hours post PCI.  CTS consult. LHC 09/29/2007 (staged PCI): Unable to revascularize RCA. Per office protocol, will you please provide recommendations for holding Plavix and Aspirin prior to extraction of 7 teeth?  Please route your response to P CV DIV Preop. I will communicate with requesting office once you have given recommendations.   Thank you!  Nicholas Levering, NP

## 2023-09-16 NOTE — Telephone Encounter (Signed)
   Name: Nicholas [name]  DOB: [birth year]  MRN: [id number]  Primary Cardiologist: Rollene Rotunda, MD   Preoperative team, please contact this patient and set up a phone call appointment for further preoperative risk assessment. Please obtain consent and complete medication review. Thank you for your help.  I confirm that guidance regarding antiplatelet and oral anticoagulation therapy has been completed and, if necessary, noted below.  Patient can hold Plavix 5 days prior to procedure but should continue ASA 81 mg through the perioperative period.  I also confirmed the patient resides in the state of [place]. As per [facility] Medical Board telemedicine laws, the patient must reside in the state in which the provider is licensed.   Napoleon Form, Leodis Rains, NP 09/16/2023, 8:03 AM [facility]

## 2023-09-16 NOTE — Telephone Encounter (Signed)
 Tried to call the pt to schedule tele preop appt, though no answer.

## 2023-09-17 ENCOUNTER — Telehealth: Payer: Self-pay | Admitting: *Deleted

## 2023-09-17 NOTE — Telephone Encounter (Signed)
 Pt has been scheduled tele preop appt 09/23/23. Med rec and consent are done.

## 2023-09-17 NOTE — Telephone Encounter (Signed)
 Pt has been scheduled tele preop appt 09/23/23. Med rec and consent are done.     Patient Consent for Virtual Visit        Nicholas [name] has provided verbal consent on 09/17/2023 for a virtual visit (video or telephone).   CONSENT FOR VIRTUAL VISIT FOR:  Nicholas [name]  By participating in this virtual visit I agree to the following:  I hereby voluntarily request, consent and authorize [facility] and its employed or contracted physicians, physician assistants, nurse practitioners or other licensed health care professionals (the Practitioner), to provide me with telemedicine health care services (the "Services") as deemed necessary by the treating Practitioner. I acknowledge and consent to receive the Services by the Practitioner via telemedicine. I understand that the telemedicine visit will involve communicating with the Practitioner through live audiovisual communication technology and the disclosure of certain medical information by electronic transmission. I acknowledge that I have been given the opportunity to request an in-person assessment or other available alternative prior to the telemedicine visit and am voluntarily participating in the telemedicine visit.  I understand that I have the right to withhold or withdraw my consent to the use of telemedicine in the course of my care at any time, without affecting my right to future care or treatment, and that the Practitioner or I may terminate the telemedicine visit at any time. I understand that I have the right to inspect all information obtained and/or recorded in the course of the telemedicine visit and may receive copies of available information for a reasonable fee.  I understand that some of the potential risks of receiving the Services via telemedicine include:  Delay or interruption in medical evaluation due to technological equipment failure or disruption; Information transmitted may not be sufficient (e.g. poor  resolution of images) to allow for appropriate medical decision making by the Practitioner; and/or  In rare instances, security protocols could fail, causing a breach of personal health information.  Furthermore, I acknowledge that it is my responsibility to provide information about my medical history, conditions and care that is complete and accurate to the best of my ability. I acknowledge that Practitioner's advice, recommendations, and/or decision may be based on factors not within their control, such as incomplete or inaccurate data provided by me or distortions of diagnostic images or specimens that may result from electronic transmissions. I understand that the practice of medicine is not an exact science and that Practitioner makes no warranties or guarantees regarding treatment outcomes. I acknowledge that a copy of this consent can be made available to me via my patient portal [facility] MyChart), or I can request a printed copy by calling the office of [facility].    I understand that my insurance will be billed for this visit.   I have read or had this consent read to me. I understand the contents of this consent, which adequately explains the benefits and risks of the Services being provided via telemedicine.  I have been provided ample opportunity to ask questions regarding this consent and the Services and have had my questions answered to my satisfaction. I give my informed consent for the services to be provided through the use of telemedicine in my medical care

## 2023-09-17 NOTE — Telephone Encounter (Signed)
 Called patient spoke with spouse patient was out and requested to call patient in a couple minutes to schedule tele appt

## 2023-09-22 NOTE — Progress Notes (Unsigned)
 Virtual Visit via Telephone Note   Because of Nicholas [name] co-morbid illnesses, he is at least at moderate risk for complications without adequate follow up.  This format is felt to be most appropriate for this patient at this time.  Due to technical limitations with video connection (technology), today's appointment will be conducted as an audio only telehealth visit, and Nicholas [name] verbally agreed to proceed in this manner.   All issues noted in this document were discussed and addressed.  No physical exam could be performed with this format.  Evaluation Performed:  Preoperative cardiovascular risk assessment _____________   Date:  09/22/2023   Patient ID:  Nicholas [name], DOB 11-Dec-1940, MRN [id number] Patient Location:  Home Provider location:   Office  Primary Care Provider:  Kirt Boys, PA-C Primary Cardiologist:  Rollene Rotunda, MD  Chief Complaint / Patient Profile   83 y.o. y/o male with a h/o CAD s/p PCI to RCA 2006 with ISR BMS placed 2009, HTN, HLD who is pending dental extraction of 7 teeth and presents today for telephonic preoperative cardiovascular risk assessment.  History of Present Illness    Nicholas [name] is a 83 y.o. male who presents via audio/video conferencing for a telehealth visit today.  Pt was last seen in cardiology clinic on 01/15/2023 by Carlos Levering, NP.  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 The patient is now pending procedure as outlined above. Since his last visit, he has been doing well and reports no new cardiac complaints.  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  He is able to complete well above 4 METS of activity without any difficulties or complaints.  He denies chest pain, shortness of breath, lower extremity edema, fatigue, palpitations, melena, hematuria, hemoptysis,  diaphoresis, weakness, presyncope, syncope, orthopnea, and PND.    Past Medical History    Past Medical History:  Diagnosis Date   CAD (coronary artery disease)    2009 inferior myocardial infarction. Previous stenting of his right coronary artery. Subsequently, he had stent thrombosis in the right coronary stent and failed to keep that vessel open even after restenting. He also had LAD 50-60% stenosis, mid to proximal, and OM1 40% stenosis. Well-preserved ejection fraction   Chronic kidney disease    kidney stones   Hemorrhoids    Hyperlipidemia    Hypertension    Plantar fasciitis of left foot 04/17/2016   Prostate cancer [facility])    s/p resection in april 2005   Past Surgical History:  Procedure Laterality Date   PROSTATECTOMY  2007    Allergies  No Known Allergies  Home Medications    Prior to Admission medications   Medication Sig Start Date End Date Taking? Authorizing Provider  aspirin 81 MG tablet Take 81 mg by mouth daily.    [provider]  atorvastatin (LIPITOR) 80 MG tablet Take 80 mg by mouth daily.    [provider]  Cholecalciferol (VITAMIN D3) 1000 UNITS CAPS Take 1 capsule by mouth daily. Patient not taking: Reported on 09/17/2023    [provider]  clopidogrel (PLAVIX) 75 MG tablet Take 1 tablet (75 mg total) by mouth daily. Patient not taking: Reported on 09/17/2023 12/27/12   Rollene Rotunda, MD  diphenhydramine-acetaminophen (TYLENOL PM) 25-500 MG TABS tablet Take 1 tablet by mouth at bedtime as  needed (FOR PAIN/SLEEP).    [provider]  famotidine (PEPCID) 20 MG tablet Take 1 tablet by mouth daily. Patient not taking: Reported on 09/17/2023 01/19/20   [provider]  fish oil-omega-3 fatty acids 1000 MG capsule Take 1 g by mouth 2 (two) times daily.    [provider]  ketoconazole (NIZORAL) 2 % cream as needed. Patient not taking: Reported on 09/17/2023 04/10/20   [provider]  loratadine  (CLARITIN) 10 MG tablet Take 10 mg by mouth daily as needed for allergies. Patient not taking: Reported on 09/17/2023    [provider]  losartan (COZAAR) 25 MG tablet TAKE 1 TABLET BY MOUTH EVERY DAY 07/15/23   Eilleen Grates, MD  metoprolol tartrate (LOPRESSOR) 25 MG tablet Take 1 tablet (25 mg total) by mouth 2 (two) times daily. 05/16/20   Eilleen Grates, MD  nitroGLYCERIN (NITROSTAT) 0.4 MG SL tablet DISSOLVE 1 TAB UNDER TOUNGE FOR CHEST PAIN. MAY REPEAT EVERY 5 MINUTES FOR 3 DOSES. IF NO RELIEF CALL 911 OR GO TO ER Patient not taking: Reported on 09/17/2023 12/02/22   Eilleen Grates, MD  SPIRIVA HANDIHALER 18 MCG inhalation capsule as needed. Patient not taking: Reported on 09/17/2023 02/19/20   [provider]  vitamin B-12 (CYANOCOBALAMIN) 100 MCG tablet Take by mouth daily. AS DIRECTED    [provider]    Physical Exam    Vital Signs:  Nicholas [name] does not have vital signs available for review today.  Given telephonic nature of communication, physical exam is limited. AAOx3. NAD. Normal affect.  Speech and respirations are unlabored.  Accessory Clinical Findings    None  Assessment & Plan    1.  Preoperative Cardiovascular Risk Assessment: - Patient's RCRI score is 0.9%  The patient affirms he has been doing well without any new cardiac symptoms. They are able to achieve 7 METS without cardiac limitations. Therefore, based on ACC/AHA guidelines, the patient would be at acceptable risk for the planned procedure without further cardiovascular testing. The patient was advised that if he develops new symptoms prior to surgery to contact our office to arrange for a follow-up visit, and he verbalized understanding.   The patient was advised that if he develops new symptoms prior to surgery to contact our office to arrange for a follow-up visit, and he verbalized understanding.  Patient can hold Plavix 5 days prior to procedure but should continue ASA 81  mg through the perioperative period.   A copy of this note will be routed to requesting surgeon.  Time:   Today, I have spent 8 minutes with the patient with telehealth technology discussing medical history, symptoms, and management plan.     Francene Ing, Retha Cast, NP  09/22/2023, 3:15 PM

## 2023-09-23 ENCOUNTER — Ambulatory Visit: Payer: Self-pay | Attending: Nurse Practitioner

## 2023-09-23 DIAGNOSIS — Z01810 Encounter for preprocedural cardiovascular examination: Secondary | ICD-10-CM | POA: Diagnosis not present

## 2023-12-18 ENCOUNTER — Other Ambulatory Visit (HOSPITAL_COMMUNITY): Payer: Self-pay

## 2023-12-30 ENCOUNTER — Ambulatory Visit: Admitting: Podiatry

## 2023-12-30 ENCOUNTER — Encounter: Payer: Self-pay | Admitting: Podiatry

## 2023-12-30 DIAGNOSIS — M79675 Pain in left toe(s): Secondary | ICD-10-CM

## 2023-12-30 DIAGNOSIS — B351 Tinea unguium: Secondary | ICD-10-CM | POA: Diagnosis not present

## 2023-12-30 DIAGNOSIS — M79674 Pain in right toe(s): Secondary | ICD-10-CM

## 2023-12-30 DIAGNOSIS — Z7901 Long term (current) use of anticoagulants: Secondary | ICD-10-CM | POA: Diagnosis not present

## 2023-12-30 NOTE — Progress Notes (Signed)
  Subjective:  Patient ID: Nicholas [name], male    DOB: 12-21-1940,   MRN: [id number]  Chief Complaint  Patient presents with   Nail Problem    Toenails  Saw [facility], [facility] - saw 1 month ago; A1c - ?    83 y.o. male presents for concern of thickened elongated and painful nails that are difficult to trim. Requesting to have them trimmed today. He is on plavix  and at risk for foot care.   PCP:  Wilfrid Dedra Maus, PA-C    . Denies any other pedal complaints. Denies n/v/f/c.   Past Medical History:  Diagnosis Date   CAD (coronary artery disease)    2009 inferior myocardial infarction. Previous stenting of his right coronary artery. Subsequently, he had stent thrombosis in the right coronary stent and failed to keep that vessel open even after restenting. He also had LAD 50-60% stenosis, mid to proximal, and OM1 40% stenosis. Well-preserved ejection fraction   Chronic kidney disease    kidney stones   Hemorrhoids    Hyperlipidemia    Hypertension    Plantar fasciitis of left foot 04/17/2016   Prostate cancer [facility])    s/p resection in april 2005    Objective:  Physical Exam: Vascular: DP/PT pulses 2/4 bilateral. CFT <3 seconds. Normal hair growth on digits. No edema.  Skin. No lacerations or abrasions bilateral feet. Nails 1-5 bilateral are thickened dystrophic and with subungual debris  Musculoskeletal: MMT 5/5 bilateral lower extremities in DF, PF, Inversion and Eversion. Deceased ROM in DF of ankle joint.  Neurological: Sensation intact to light touch.   Assessment:   1. Pain due to onychomycosis of toenails of both feet   2. Chronic anticoagulation      Plan:  Patient was evaluated and treated and all questions answered. -Discussed and educated patient on foot care, especially with  regards to the vascular, neurological and musculoskeletal systems.  -Stressed the importance of good glycemic control and the detriment of not  controlling glucose levels in relation  to the foot. -Discussed supportive shoes at all times and checking feet regularly.  -Mechanically debrided all nails 1-5 bilateral using sterile nail nipper and filed with dremel without incident  -Answered all patient questions -Patient to return  in 3 months for at risk foot care -Patient advised to call the office if any problems or questions arise in the meantime.   Asberry Failing, DPM

## 2024-03-19 NOTE — Progress Notes (Unsigned)
  Cardiology Office Note:   Date:  03/20/2024  ID:  Nicholas [name], DOB September 11, 1940, MRN [id number] PCP: Wilfrid Dedra Maus, PA-C  [facility] Providers Cardiologist:  Lynwood Schilling, MD {  History of Present Illness:   Nicholas [name] is a 83 y.o. male who presents for follow up of CAD.   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  The patient denies any new symptoms such as chest discomfort, neck or arm discomfort. There has been no new shortness of breath, PND or orthopnea. There have been no reported palpitations, presyncope or syncope.    ROS: As stated in the HPI and negative for all other systems.   Studies Reviewed:    EKG:   EKG Interpretation Date/Time:  Monday March 20 2024 10:55:50 EDT Ventricular Rate:  64 PR Interval:  188 QRS Duration:  112 QT Interval:  420 QTC Calculation: 433 R Axis:   8  Text Interpretation: Normal sinus rhythm Inferior infarct (cited on or before 19-Sep-2004) When compared with ECG of 15-Jan-2023 15:05, No significant change was found Confirmed by Schilling Lynwood ([id number]) on 03/20/2024 11:23:15 AM    Risk Assessment/Calculations:              Physical Exam:   VS:  BP 123/73   Pulse 63   Ht 5' 9 (1.753 m)   Wt 185 lb 3.2 oz (84 kg)   SpO2 97%   BMI 27.35 kg/m    Wt Readings from Last 3 Encounters:  03/20/24 185 lb 3.2 oz (84 kg)  01/15/23 192 lb 3.2 oz (87.2 kg)  06/18/22 197 lb 6.4 oz (89.5 kg)     GEN: Well nourished, well developed in no acute distress NECK: No JVD; No carotid bruits CARDIAC: RRR, no murmurs, rubs, gallops RESPIRATORY:  Clear to auscultation without rales, wheezing or rhonchi  ABDOMEN: Soft, non-tender, non-distended EXTREMITIES:  No edema; No deformity   ASSESSMENT AND PLAN:   CAD:  The patient has no new sypmtoms.  No further cardiovascular testing is indicated.  We will continue with aggressive risk reduction.  I will stop  his ASA and continue Plavix  alone.    HTN:  The blood pressure is at target.  No change in therapy.    DYSLIPIDEMIA:    LDL was 90.  I would like this to be 50s.  I will change to Crestor  40 mg daily and stop the Lipitor and get a lipid profile in about 3 months.      Follow up with me in 1 year in [place].  Signed, Lynwood Schilling, MD

## 2024-03-20 ENCOUNTER — Encounter: Payer: Self-pay | Admitting: Cardiology

## 2024-03-20 ENCOUNTER — Ambulatory Visit: Attending: Cardiology | Admitting: Cardiology

## 2024-03-20 VITALS — BP 123/73 | HR 63 | Ht 69.0 in | Wt 185.2 lb

## 2024-03-20 DIAGNOSIS — E785 Hyperlipidemia, unspecified: Secondary | ICD-10-CM | POA: Diagnosis not present

## 2024-03-20 DIAGNOSIS — I2510 Atherosclerotic heart disease of native coronary artery without angina pectoris: Secondary | ICD-10-CM

## 2024-03-20 DIAGNOSIS — I10 Essential (primary) hypertension: Secondary | ICD-10-CM

## 2024-03-20 MED ORDER — ROSUVASTATIN CALCIUM 40 MG PO TABS: 40.0000 mg | ORAL_TABLET | Freq: Every day | ORAL | 3 refills | Status: AC

## 2024-03-20 NOTE — Patient Instructions (Addendum)
 Medication Instructions:  STOP Aspirin STOP Lipitor Start Crestor  40 mg once daily *If you need a refill on your cardiac medications before your next appointment, please call your pharmacy*  Lab Work: Fasting lipid panel in 3 months at [facility] If you have labs (blood work) drawn today and your tests are completely normal, you will receive your results only by: MyChart Message (if you have MyChart) OR A paper copy in the mail If you have any lab test that is abnormal or we need to change your treatment, we will call you to review the results.  Testing/Procedures: NONE  Follow-Up: At [facility], you and your health needs are our priority.  As part of our continuing mission to provide you with exceptional heart care, our providers are all part of one team.  This team includes your primary Cardiologist (physician) and Advanced Practice Providers or APPs (Physician Assistants and Nurse Practitioners) who all work together to provide you with the care you need, when you need it.  Your next appointment:   1 year(s)  Provider:   Lynwood Schilling, MD    We recommend signing up for the patient portal called MyChart.  Sign up information is provided on this After Visit Summary.  MyChart is used to connect with patients for Virtual Visits (Telemedicine).  Patients are able to view lab/test results, encounter notes, upcoming appointments, etc.  Non-urgent messages can be sent to your provider as well.   To learn more about what you can do with MyChart, go to [email or web address].

## 2024-04-06 ENCOUNTER — Encounter: Payer: Self-pay | Admitting: Podiatry

## 2024-04-06 ENCOUNTER — Ambulatory Visit (INDEPENDENT_AMBULATORY_CARE_PROVIDER_SITE_OTHER): Admitting: Podiatry

## 2024-04-06 DIAGNOSIS — B351 Tinea unguium: Secondary | ICD-10-CM

## 2024-04-06 DIAGNOSIS — M79675 Pain in left toe(s): Secondary | ICD-10-CM | POA: Diagnosis not present

## 2024-04-06 DIAGNOSIS — M79674 Pain in right toe(s): Secondary | ICD-10-CM | POA: Diagnosis not present

## 2024-04-06 DIAGNOSIS — Z7901 Long term (current) use of anticoagulants: Secondary | ICD-10-CM

## 2024-04-06 NOTE — Progress Notes (Signed)
  Subjective:  Patient ID: Nicholas [name], male    DOB: 1941/04/02,   MRN: [id number]  Chief Complaint  Patient presents with   RFC    DFC.  Diabetic. A1c.      83 y.o. male presents for concern of thickened elongated and painful nails that are difficult to trim. Requesting to have them trimmed today. He is on plavix  and at risk for foot care.   PCP:  Wilfrid Dedra Maus, PA-C    . Denies any other pedal complaints. Denies n/v/f/c.   Past Medical History:  Diagnosis Date   CAD (coronary artery disease)    2009 inferior myocardial infarction. Previous stenting of his right coronary artery. Subsequently, he had stent thrombosis in the right coronary stent and failed to keep that vessel open even after restenting. He also had LAD 50-60% stenosis, mid to proximal, and OM1 40% stenosis. Well-preserved ejection fraction   Chronic kidney disease    kidney stones   Hemorrhoids    Hyperlipidemia    Hypertension    Plantar fasciitis of left foot 04/17/2016   Prostate cancer [facility])    s/p resection in april 2005    Objective:  Physical Exam: Vascular: DP/PT pulses 2/4 bilateral. CFT <3 seconds. Normal hair growth on digits. No edema.  Skin. No lacerations or abrasions bilateral feet. Nails 1-5 bilateral are thickened dystrophic and with subungual debris  Musculoskeletal: MMT 5/5 bilateral lower extremities in DF, PF, Inversion and Eversion. Deceased ROM in DF of ankle joint.  Neurological: Sensation intact to light touch.   Assessment:   1. Pain due to onychomycosis of toenails of both feet   2. Chronic anticoagulation      Plan:  Patient was evaluated and treated and all questions answered. -Discussed and educated patient on foot care, especially with  regards to the vascular, neurological and musculoskeletal systems.  -Discussed supportive shoes at all times and checking feet regularly.  -Mechanically debrided all nails 1-5 bilateral using sterile nail nipper and filed with  dremel without incident  -Answered all patient questions -Patient to return  in 3 months for at risk foot care -Patient advised to call the office if any problems or questions arise in the meantime.   Asberry Failing, DPM

## 2024-04-16 ENCOUNTER — Other Ambulatory Visit: Payer: Self-pay | Admitting: Cardiology

## 2024-06-30 LAB — LAB REPORT - SCANNED
A1c: 6.1
Albumin, Urine POC: 8.6
Creatinine, POC: 99.8 mg/dL
EGFR: 85.1
Microalb Creat Ratio: 9

## 2024-07-07 ENCOUNTER — Ambulatory Visit: Admitting: Podiatry
